# Patient Record
Sex: Female | Born: 1980 | Race: Black or African American | Hispanic: No | Marital: Married | Smoking: Never smoker
Health system: Southern US, Community
[De-identification: ages and names within clinical notes are randomized; demographics above are authoritative.]

## PROBLEM LIST (undated history)

## (undated) DIAGNOSIS — M1990 Unspecified osteoarthritis, unspecified site: Secondary | ICD-10-CM

## (undated) DIAGNOSIS — R112 Nausea with vomiting, unspecified: Secondary | ICD-10-CM

## (undated) DIAGNOSIS — I10 Essential (primary) hypertension: Secondary | ICD-10-CM

## (undated) DIAGNOSIS — D649 Anemia, unspecified: Secondary | ICD-10-CM

## (undated) DIAGNOSIS — Z9889 Other specified postprocedural states: Secondary | ICD-10-CM

---

## 2011-02-09 ENCOUNTER — Observation Stay (HOSPITAL_COMMUNITY)
Admission: AD | Admit: 2011-02-09 | Discharge: 2011-02-10 | Disposition: A | Discharge status: Home / Self Care | Payer: BC Managed Care – PPO | Source: Other Acute Inpatient Hospital | Attending: Internal Medicine | Admitting: Internal Medicine

## 2011-02-09 ENCOUNTER — Emergency Department (HOSPITAL_BASED_OUTPATIENT_CLINIC_OR_DEPARTMENT_OTHER)
Admission: EM | Admit: 2011-02-09 | Discharge: 2011-02-09 | Disposition: A | Discharge status: Nursing Home (Includes ICF) | Payer: BC Managed Care – PPO | Source: Home / Self Care | Attending: Emergency Medicine | Admitting: Emergency Medicine

## 2011-02-09 ENCOUNTER — Emergency Department (INDEPENDENT_AMBULATORY_CARE_PROVIDER_SITE_OTHER): Payer: BC Managed Care – PPO

## 2011-02-09 ENCOUNTER — Emergency Department (HOSPITAL_BASED_OUTPATIENT_CLINIC_OR_DEPARTMENT_OTHER): Payer: BC Managed Care – PPO

## 2011-02-09 DIAGNOSIS — I10 Essential (primary) hypertension: Secondary | ICD-10-CM | POA: Insufficient documentation

## 2011-02-09 DIAGNOSIS — R079 Chest pain, unspecified: Secondary | ICD-10-CM

## 2011-02-09 DIAGNOSIS — E669 Obesity, unspecified: Secondary | ICD-10-CM | POA: Insufficient documentation

## 2011-02-09 LAB — DIFFERENTIAL
Eosinophils Absolute: 0.1 10*3/uL (ref 0.0–0.7)
Lymphocytes Relative: 44 % (ref 12–46)
Lymphs Abs: 3.2 10*3/uL (ref 0.7–4.0)
Neutrophils Relative %: 46 % (ref 43–77)

## 2011-02-09 LAB — POCT CARDIAC MARKERS
CKMB, poc: <1 ng/mL — ABNORMAL LOW (ref 1.0–8.0)
CKMB, poc: <1 ng/mL — ABNORMAL LOW (ref 1.0–8.0)
Myoglobin, poc: 35.9 ng/mL (ref 12–200)
Troponin i, poc: <0.05 ng/mL (ref 0.00–0.09)

## 2011-02-09 LAB — CBC
MCV: 88.6 fL (ref 78.0–100.0)
Platelets: 262 10*3/uL (ref 150–400)
RBC: 4.21 MIL/uL (ref 3.87–5.11)
WBC: 7.3 10*3/uL (ref 4.0–10.5)

## 2011-02-09 LAB — BASIC METABOLIC PANEL
BUN: 13 mg/dL (ref 6–23)
Chloride: 106 mEq/L (ref 96–112)
Potassium: 3.6 mEq/L (ref 3.5–5.1)
Sodium: 142 mEq/L (ref 135–145)

## 2011-02-09 LAB — D-DIMER, QUANTITATIVE: D-Dimer, Quant: 0.35 ug/mL-FEU (ref 0.00–0.48)

## 2011-02-10 DIAGNOSIS — R079 Chest pain, unspecified: Secondary | ICD-10-CM

## 2011-02-10 LAB — CARDIAC PANEL(CRET KIN+CKTOT+MB+TROPI)
CK, MB: 1.1 ng/mL (ref 0.3–4.0)
Total CK: 192 U/L — ABNORMAL HIGH (ref 7–177)
Total CK: 200 U/L — ABNORMAL HIGH (ref 7–177)
Troponin I: 0.01 ng/mL (ref 0.00–0.06)
Troponin I: 0.02 ng/mL (ref 0.00–0.06)

## 2011-02-10 LAB — LIPID PANEL
HDL: 49 mg/dL (ref >39)
Total CHOL/HDL Ratio: 2.6 RATIO
VLDL: 20 mg/dL (ref 0–40)

## 2011-02-14 NOTE — Consult Note (Signed)
[name], [name]            ACCOUNT NO.:  [id number]  MEDICAL RECORD NO.:  [id number]           PATIENT TYPE:  O  LOCATION:  2010                         FACILITY:  [facility]  PHYSICIAN:  Madolyn Frieze. Jens Som, MD, FACCDATE OF BIRTH:  1981/04/16  DATE OF CONSULTATION:  02/10/2011 DATE OF DISCHARGE:  02/10/2011                                CONSULTATION   REFERRING PHYSICIAN:  Teressa Lower.  The patient is a 30 year old female with past medical history of hypertension who I am asked to evaluate for chest pain.  The patient typically does not have dyspnea on exertion, orthopnea, PND, palpitations, syncope, or exertional chest pain.  She occasionally has mild pedal edema.  On February 08, 2011, at approximately noon, the patient developed an aching in her substernal area.  There was no associated nausea, vomiting, or diaphoresis.  The pain was not pleuritic or positional nor is it related to food.  It was not exertional.  The pain persisted for the rest of the day on Tuesday.  When she awoke yesterday morning, the pain had resolved.  However, it did return and was intermittent throughout the day yesterday.  She therefore presented and was admitted for rule out myocardial infarction.  Chest pain is now resolved, and Cardiology is asked to further evaluate.  Note, she did have an episode of nausea with vomiting approximately 2 weeks ago, but no other URI symptoms recently.  She has not had fevers or chills.  Her present medications include Protonix.  SOCIAL HISTORY:  She does not smoke.  She rarely consumes alcohol.  She denies any cocaine use.  FAMILY HISTORY:  Negative for coronary artery disease.  PAST MEDICAL HISTORY:  Significant for hypertension, but there is no diabetes or hyperlipidemia.  She has no other past medical history.  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  REVIEW OF SYSTEMS:  She denies any headaches, fevers, or chills.  There is no productive cough or  hemoptysis.  There is no dysphagia, odynophagia, melena, or hematochezia.  There is no dysuria or hematuria. There is no rash or seizure activity.  There is no orthopnea, PND, or pedal edema at present.  The remaining systems are negative.  PHYSICAL EXAMINATION:  VITAL SIGNS:  Blood pressure of 112/60, pulse is 71, temperature is 98.1. GENERAL:  She is well-developed and somewhat obese.  She is in no acute distress at present. SKIN:  Warm and dry.  She does not appear to be depressed.  There is no peripheral clubbing. BACK:  Normal. HEENT:  Normal eyelids. NECK:  Supple with normal upstroke bilaterally and no bruits noted. There is no jugular venous distention and no thyromegaly is noted. CHEST:  Clear to auscultation with normal expansion. CARDIOVASCULAR:  Regular rate and rhythm.  Normal S1 and S2.  There are no murmurs, rubs, or gallops noted. ABDOMEN:  Nontender and nondistended.  Positive bowel sounds.  No hepatosplenomegaly.  No mass appreciated.  There is no abdominal bruit. EXTREMITIES:  She is 2+ femoral pulses bilaterally.  No bruits.  No edema.  I can palpate no cords.  She has 2+ dorsalis pedis pulses bilaterally.  NEUROLOGIC:  Grossly intact.  LABORATORY DATA:  White blood cell count of 7.3 with a hemoglobin of 12.8 and hematocrit 37.3, her platelet count is 262.  Her sodium is 142 with a potassium of 3.6.  BUN and creatinine are 13 and 0.7.  A D-dimer is normal.  Cardiac markers are negative.  Her LDL is 57 with an HDL of 49.  Her electrocardiogram today shows a sinus rhythm at a rate of 70. There is 1 mm of ST elevation and laterally.  There are nonspecific T- wave changes in the inferior leads.  There is left ventricle hypertrophy.  Chest x-ray shows streaky atelectasis.  DIAGNOSES: 1. Chest pain - the etiology of the patient's pain is unclear.  I do     not think the pain is consistent with an ischemic etiology.  Her     pain lasted for 12 hours on Tuesday and was  intermittent for most     today yesterday.  Her enzymes have been negative.  She also has a     paucity of risk factors.  She does have hypertension, but there is     no diabetes, hyperlipidemia, family history, or history of tobacco     use.  She also denies any cocaine use.  I do not think further     ischemia evaluation is indicated.  Given the lateral ST elevation,     I wonder if there may be a component of myocarditis or     pericarditis.  She did have an episode of nausea and vomiting     approximately 2 weeks ago, but no other upper respiratory     infections symptoms prior to this event.  We will await her     echocardiogram.  If normal, I think she most likely can be     discharged with a short course of nonsteroidals.  I wonder if there     may be a musculoskeletal component as well.  Note, her D-dimer is     normal as well. 2. Abnormal electrocardiogram - I wonder if this is secondary to     myocarditis or pericarditis.  There may be also a component of     repolarization abnormality.  Regardless, her enzymes are negative     and her story is not consistent with an ischemic etiology. 3. Hypertension - her blood pressure appears to be controlled on     present medications.  We will review the patient's echocardiogram, but again she can be discharged later today if it is normal.     Madolyn Frieze. Jens Som, MD, [facility]     BSC/MEDQ  D:  02/10/2011  T:  02/11/2011  Job:  [id number]  Electronically Signed by Olga Millers MD [facility] on 02/14/2011 07:25:17 PM

## 2011-02-27 NOTE — Discharge Summary (Signed)
  NAMECHRISTINNA, [name]            ACCOUNT NO.:  [id number]  MEDICAL RECORD NO.:  [id number]           PATIENT TYPE:  O  LOCATION:  2010                         FACILITY:  [facility]  PHYSICIAN:  Osvaldo Shipper, MD     DATE OF BIRTH:  1981/04/06  DATE OF ADMISSION:  02/09/2011 DATE OF DISCHARGE:  02/10/2011                              DISCHARGE SUMMARY   PRIMARY CARE PHYSICIAN:  Located at another town, [place].  CONSULTATION DURING THIS ADMISSION:  [facility], Dr. Jens Som.  IMAGING STUDIES DONE DURING THIS ADMISSION:  Included chest x-ray which did not show any acute abnormalities.  Other studies done include an echocardiogram which showed EF as 55-60% with normal wall motion without any other significant abnormalities.  PERTINENT LABS:  Include normal CBC, normal BMET, normal D-dimer. Cardiac enzymes were negative.  Lipid panel was also reasonably good.  DISCHARGE DIAGNOSES: 1. Chest pain, thought to be either musculoskeletal or some form of     myocarditis. 2. Obesity. 3. Hypertension.  BRIEF HOSPITAL COURSE:  Briefly, this is a 30 year old African American female who presented to the hospital with left-sided chest pain. Somewhat exertional at times.  The patient was evaluated in the ED and was found to have nonspecific EKG changes in the form of T inversion and some ST depression, especially in lead I and II.  The patient was subsequently admitted for further evaluation.  The patient ruled out for acute coronary syndrome.  Because of the exertional quality of her pain and her EKG findings, Cardiology was consulted and echocardiogram was already ordered.  Dr. Jens Som from [facility] saw the patient and felt that this is either musculoskeletal or some form of myocarditis and recommends NSAID use, so NSAIDs will be provided to this patient.  This can be taken as needed.  An echocardiogram also did not show any wall motion abnormality with normal EF.  In view of this,  Cardiology felt the patient could be discharged, so we will go ahead and discharge this patient.  DISCHARGE MEDICATIONS: 1. Ibuprofen 400 mg t.i.d. before meals as needed for chest pain. 2. Norvasc 5 mg 2 tablets daily. 3. Imelda Pillow birth control pill 1 tablet daily.  FOLLOWUP: 1. I will provide the patient with the phone number for Dr. Ludwig Clarks     office.  If she had anymore chest pain, she can call his office for     an appointment, 2. She needs to follow up with her PCP within 1-2 weeks.  DIET:  Heart healthy.  PHYSICAL ACTIVITY:  As tolerated.  TOTAL TIME ON THIS DISCHARGE ENCOUNTER:  Thirty minutes.  Osvaldo Shipper, MD     GK/MEDQ  D:  02/10/2011  T:  02/11/2011  Job:  [id number]  cc:   Madolyn Frieze. Jens Som, MD, [facility]  Electronically Signed by Osvaldo Shipper MD on 02/27/2011 09:52:06 PM

## 2011-03-03 NOTE — H&P (Signed)
[name], AHN NO.:  [id number]  MEDICAL RECORD NO.:  [id number]           PATIENT TYPE:  I  LOCATION:  2010                         FACILITY:  [facility]  PHYSICIAN:  Massie Maroon, MD        DATE OF BIRTH:  Oct 10, 1981  DATE OF ADMISSION:  02/09/2011 DATE OF DISCHARGE:                             HISTORY & PHYSICAL   CHIEF COMPLAINT:  Chest pain.  HISTORY OF PRESENT ILLNESS:  A 30 year old female with complaints of chest pain starting yesterday at about lunch time.  The pain was substernal and achy.  It lasted for 2 hours yesterday and then returned today intermittently.  The patient denies any fever, chills, cough, radiation of the pain, palpitations, nausea, vomiting, bright red blood per rectum, or black stool.  Nothing appeared to make it better or worse.  The patient denies any heartburn and there is no family history of heart disease.  The patient had an EKG that shows normal sinus rhythm at 70, right axis deviation, T-wave inversion in I, II, and aVL.  There is no prior EKG for comparison.  Chest x-ray was negative for any acute process.  The patient is presently chest pain free.  She apparently received aspirin in the ER.  The patient will be admitted for chest pain rule out.  PAST MEDICAL HISTORY:  Hypertension.  PAST SURGICAL HISTORY:  Keloid removal from her ear.  SOCIAL HISTORY:  The patient does not smoke or drink.  She works for [employer].  She is married.  FAMILY HISTORY:  Positive for cancer, but no heart disease.  ALLERGIES:  No known drug allergies.  MEDICATIONS:  Norvasc 5 mg p.o. b.i.d.  REVIEW OF SYSTEMS:  Negative for all 10 organ systems except for pertinent positives as stated above.  PHYSICAL EXAMINATION:  VITAL SIGNS:  Temperature 98.6, pulse 74, blood pressure 134/79, pulse ox 99% on room air. HEENT:  Anicteric. NECK:  No JVD.  No bruit. HEART:  Regular rate and rhythm.  S1 and S2.  No murmurs,  gallops, or rubs. LUNGS:  Clear to auscultation bilaterally. ABDOMEN:  Soft, nontender, nondistended.  Positive bowel sounds. EXTREMITIES:  No cyanosis, clubbing, or edema. SKIN:  No rashes. Lymph Nodes:  No adenopathy. NEUROLOGIC:  Nonfocal.  LABORATORY DATA:  WBC 7.3, hemoglobin 12.8, platelet count is 262. Sodium 142, potassium 3.6, BUN 13, creatinine 0.7.  D-dimer 0.35 negative.  Troponin-I less than 0.05, repeat troponin-I less than 0.05. Chest x-ray, heart is upper limits of normal, low lung volumes with vascular crowding and streaking atelectasis, no infiltrates, edema, or effusions.  EKG, see above HPI.  ASSESSMENT AND PLAN: 1. Chest pain:  The patient placed on telemetry.  We will check CK, CK-     MB, troponin I q.6 hours x3 sets.  We will check a lipid panel,     check cardiac 2-D echo and start patient on Protonix 40 mg p.o.     daily.  If patient remains chest pain free, she can have a further     workup as an outpatient with her primary care physician. 2. Deep vein thrombosis  prophylaxis:  Sequential compression devices.     Massie Maroon, MD     JYK/MEDQ  D:  02/09/2011  T:  02/10/2011  Job:  [id number]  Electronically Signed by Pearson Grippe MD on 03/03/2011 06:51:29 AM

## 2012-05-05 IMAGING — CR DG CHEST 1V PORT
1 series · 1 of 1 positions shown · non-contrast
Comparison: None

CLINICAL DATA: Chest pain.

PORTABLE CHEST - 1 VIEW

[view not recorded]
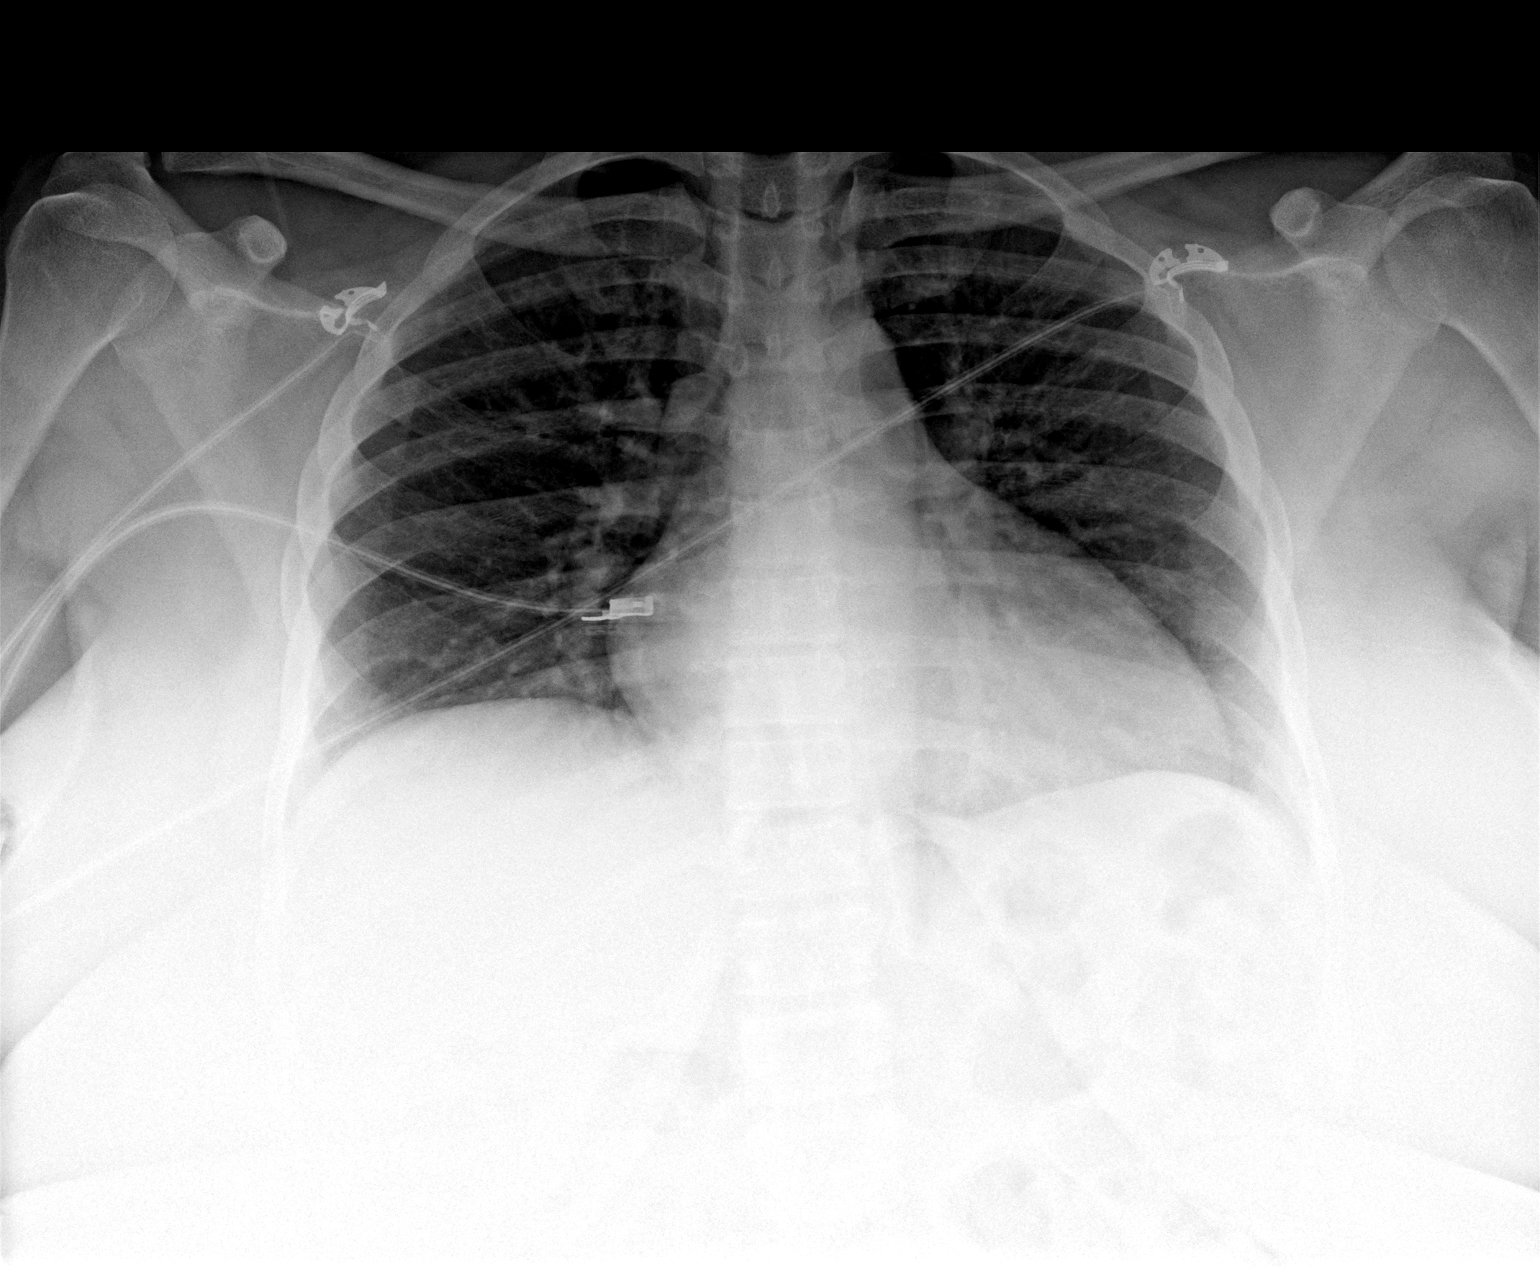

[1 of 1 positions shown; findings below may reference images not displayed]

FINDINGS: The heart is upper limits of normal given the portable AP
projection and low lung volumes.  Low lung volumes with vascular
crowding and areas of atelectasis but no infiltrates, edema or
effusions.  The bony thorax is intact.
IMPRESSION: Low lung volumes with vascular crowding and streaky atelectasis.
No infiltrates, edema or effusions.

## 2014-01-22 ENCOUNTER — Ambulatory Visit (HOSPITAL_BASED_OUTPATIENT_CLINIC_OR_DEPARTMENT_OTHER): Payer: BC Managed Care – PPO | Attending: Internal Medicine | Admitting: Radiology

## 2014-01-22 VITALS — Ht 64.0 in | Wt 245.0 lb

## 2014-01-22 DIAGNOSIS — R5381 Other malaise: Secondary | ICD-10-CM | POA: Insufficient documentation

## 2014-01-22 DIAGNOSIS — I10 Essential (primary) hypertension: Secondary | ICD-10-CM

## 2014-01-22 DIAGNOSIS — R5383 Other fatigue: Secondary | ICD-10-CM

## 2014-01-26 DIAGNOSIS — R5383 Other fatigue: Secondary | ICD-10-CM

## 2014-01-26 DIAGNOSIS — I10 Essential (primary) hypertension: Secondary | ICD-10-CM

## 2014-01-26 DIAGNOSIS — R5381 Other malaise: Secondary | ICD-10-CM

## 2014-01-26 DIAGNOSIS — G4730 Sleep apnea, unspecified: Secondary | ICD-10-CM

## 2014-01-26 DIAGNOSIS — G4710 Hypersomnia, unspecified: Secondary | ICD-10-CM

## 2014-01-26 NOTE — Sleep Study (Signed)
   NAME: [name] DATE OF BIRTH:  [birth year] MEDICAL RECORD NUMBER [id number]  LOCATION: [facility]  PHYSICIAN: Jaeli Grubb D Eryca Bolte  DATE OF STUDY: 01/22/2014  SLEEP STUDY TYPE: Nocturnal Polysomnogram               REFERRING PHYSICIAN: Baird Lyons D, MD  INDICATION FOR STUDY: Hypersomnia with sleep apnea  EPWORTH SLEEPINESS SCORE:   4/24 HEIGHT: 5\' 4"  (162.6 cm)  WEIGHT: 245 lb (111.131 kg)    Body mass index is 42.03 kg/(m^2).  NECK SIZE: 14.5 in.  MEDICATIONS: Charted for review  SLEEP ARCHITECTURE: Total sleep time 302 minutes with sleep efficiency 73.4%. Stage I was 9.6%, stage II 69.9%, stage III 0.2%, REM 20.4% of total sleep time. Sleep latency 15 minutes, REM latency 150 minutes, awake after sleep onset 59.5 minutes, arousal index 19.3. Bedtime medication: Amlodipine  RESPIRATORY DATA: Apnea hypopnea index (AHI) 2.6 per hour. 13 total events scored including one obstructive apnea, 4 central apneas, 8 hypopneas. Most events were while supine. REM AHI 9.8 per hour. CPAP titration was not done.  OXYGEN DATA: Mild intermittent snoring with oxygen desaturation to a nadir at 88% and mean oxygen saturation through the study of 95.3% on room air.  CARDIAC DATA: Normal sinus rhythm  MOVEMENT/PARASOMNIA: 28 limb jerks were counted but none were associated with sleep disturbance. Bathroom x1  IMPRESSION/ RECOMMENDATION:   1) Some difficulty initiating sleep, with sustained sleep not obtained until 12:15 AM. Otherwise unremarkable sleep pattern. 2) Occasional respiratory events with sleep disturbance, within normal limits.  AHI 2.6 per hour(the normal range for adults as an AHI from 0-5 events per hour). Mild intermittent snoring with oxygen desaturation to a nadir of 88% and mean oxygen saturation through the study of 95.3% on room air.   Signs Baird Lyons M.D. Deneise Lever is Diplomate, Tax adviser of Sleep Medicine  ELECTRONICALLY SIGNED ON:   01/26/2014, 8:32 AM [facility] PH: [phone]   FX: [phone] [place]

## 2015-10-01 ENCOUNTER — Emergency Department (HOSPITAL_BASED_OUTPATIENT_CLINIC_OR_DEPARTMENT_OTHER)
Admission: EM | Admit: 2015-10-01 | Discharge: 2015-10-01 | Disposition: A | Discharge status: Home / Self Care | Payer: BC Managed Care – PPO | Attending: Physician Assistant | Admitting: Physician Assistant

## 2015-10-01 ENCOUNTER — Encounter (HOSPITAL_BASED_OUTPATIENT_CLINIC_OR_DEPARTMENT_OTHER): Payer: Self-pay

## 2015-10-01 DIAGNOSIS — I10 Essential (primary) hypertension: Secondary | ICD-10-CM | POA: Diagnosis not present

## 2015-10-01 DIAGNOSIS — Z3202 Encounter for pregnancy test, result negative: Secondary | ICD-10-CM | POA: Diagnosis not present

## 2015-10-01 DIAGNOSIS — R112 Nausea with vomiting, unspecified: Secondary | ICD-10-CM | POA: Diagnosis not present

## 2015-10-01 DIAGNOSIS — E669 Obesity, unspecified: Secondary | ICD-10-CM | POA: Insufficient documentation

## 2015-10-01 HISTORY — DX: Essential (primary) hypertension: I10

## 2015-10-01 LAB — URINALYSIS, ROUTINE W REFLEX MICROSCOPIC
Bilirubin Urine: NEGATIVE
Glucose, UA: NEGATIVE mg/dL
Hgb urine dipstick: NEGATIVE
Ketones, ur: NEGATIVE mg/dL
LEUKOCYTES UA: NEGATIVE
Nitrite: NEGATIVE
PROTEIN: NEGATIVE mg/dL
Specific Gravity, Urine: 1.014 (ref 1.005–1.030)
pH: 7 (ref 5.0–8.0)

## 2015-10-01 LAB — COMPREHENSIVE METABOLIC PANEL
ALBUMIN: 3.8 g/dL (ref 3.5–5.0)
ALT: 16 U/L (ref 14–54)
AST: 22 U/L (ref 15–41)
Alkaline Phosphatase: 86 U/L (ref 38–126)
Anion gap: 7 (ref 5–15)
BUN: 13 mg/dL (ref 6–20)
CHLORIDE: 105 mmol/L (ref 101–111)
CO2: 26 mmol/L (ref 22–32)
Calcium: 8.9 mg/dL (ref 8.9–10.3)
Creatinine, Ser: 0.86 mg/dL (ref 0.44–1.00)
GFR calc Af Amer: >60 mL/min (ref >60)
GLUCOSE: 150 mg/dL — AB (ref 65–99)
POTASSIUM: 3.2 mmol/L — AB (ref 3.5–5.1)
SODIUM: 138 mmol/L (ref 135–145)
Total Bilirubin: 0.4 mg/dL (ref 0.3–1.2)
Total Protein: 7.5 g/dL (ref 6.5–8.1)

## 2015-10-01 LAB — CBC WITH DIFFERENTIAL/PLATELET
Basophils Absolute: 0 10*3/uL (ref 0.0–0.1)
Basophils Relative: 0 %
EOS PCT: 1 %
Eosinophils Absolute: 0.1 10*3/uL (ref 0.0–0.7)
HCT: 40.2 % (ref 36.0–46.0)
Hemoglobin: 13.3 g/dL (ref 12.0–15.0)
LYMPHS ABS: 3 10*3/uL (ref 0.7–4.0)
LYMPHS PCT: 32 %
MCH: 30.2 pg (ref 26.0–34.0)
MCHC: 33.1 g/dL (ref 30.0–36.0)
MCV: 91.4 fL (ref 78.0–100.0)
Monocytes Absolute: 0.6 10*3/uL (ref 0.1–1.0)
Monocytes Relative: 7 %
Neutro Abs: 5.5 10*3/uL (ref 1.7–7.7)
Neutrophils Relative %: 60 %
PLATELETS: 312 10*3/uL (ref 150–400)
RBC: 4.4 MIL/uL (ref 3.87–5.11)
RDW: 13.3 % (ref 11.5–15.5)
WBC: 9.2 10*3/uL (ref 4.0–10.5)

## 2015-10-01 LAB — PREGNANCY, URINE: PREG TEST UR: NEGATIVE

## 2015-10-01 MED ORDER — ONDANSETRON 4 MG PO TBDP
4.0000 mg | ORAL_TABLET | Freq: Once | ORAL | Status: AC
  Administered 2015-10-01: 4 mg via ORAL
  Filled 2015-10-01: qty 1

## 2015-10-01 MED ORDER — KETOROLAC TROMETHAMINE 30 MG/ML IJ SOLN: 30.0000 mg | Freq: Once | INTRAMUSCULAR | Status: DC

## 2015-10-01 MED ORDER — SODIUM CHLORIDE 0.9 % IV BOLUS (SEPSIS)
1000.0000 mL | Freq: Once | INTRAVENOUS | Status: AC
  Administered 2015-10-01: 1000 mL via INTRAVENOUS

## 2015-10-01 MED ORDER — ONDANSETRON HCL 4 MG/2ML IJ SOLN
4.0000 mg | Freq: Once | INTRAMUSCULAR | Status: AC
  Administered 2015-10-01: 4 mg via INTRAVENOUS
  Filled 2015-10-01: qty 2

## 2015-10-01 MED ORDER — ONDANSETRON HCL 4 MG PO TABS: 4.0000 mg | ORAL_TABLET | Freq: Three times a day (TID) | ORAL | Status: DC | PRN

## 2015-10-01 MED ORDER — DICYCLOMINE HCL 10 MG PO CAPS
10.0000 mg | ORAL_CAPSULE | Freq: Once | ORAL | Status: AC
  Administered 2015-10-01: 10 mg via ORAL
  Filled 2015-10-01: qty 1

## 2015-10-01 NOTE — ED Notes (Signed)
Pt up to use the bathroom

## 2015-10-01 NOTE — ED Notes (Signed)
Pt up to use the bathroom and had an episode of vomiting.

## 2015-10-01 NOTE — ED Provider Notes (Signed)
CSN: [id number]     Arrival date & time 10/01/15  0107 History   First MD Initiated Contact with Patient 10/01/15 0124     Chief Complaint  Patient presents with  . Abdominal Pain     (Consider location/radiation/quality/duration/timing/severity/associated sxs/prior Treatment) HPI  Patient is an obese 34 year old female presenting today with sudden onset of vomiting. Patient had 3 episodes of vomiting starting 2 hours prior to arrival. She tried nothing home to make it better or worse. Patient states she ate her normal dinner with her husband last night which was garlic bread with tomato sauce on it. Patient denies any diarrhea yet. Denies any fever. Denies any sick contacts. Denies any exposure to children.     Past Medical History  Diagnosis Date  . Hypertension    History reviewed. No pertinent past surgical history. No family history on file. Social History  Substance Use Topics  . Smoking status: None  . Smokeless tobacco: None  . Alcohol Use: None   OB History    No data available     Review of Systems  Constitutional: Negative for fever, activity change and fatigue.  Respiratory: Negative for shortness of breath.   Cardiovascular: Negative for chest pain.  Gastrointestinal: Positive for nausea and vomiting. Negative for abdominal pain.  Genitourinary: Negative for dysuria.  Musculoskeletal: Negative for back pain.  Psychiatric/Behavioral: Negative for agitation.      Allergies  Review of patient's allergies indicates no known allergies.  Home Medications   Prior to Admission medications   Not on File   BP 132/81 mmHg  Pulse 87  Temp(Src) 98.3 F (36.8 C) (Oral)  Resp 18  Ht 5\' 4"  (1.626 m)  Wt 251 lb (113.853 kg)  BMI 43.06 kg/m2  SpO2 100%  LMP 09/24/2015 Physical Exam  Constitutional: She is oriented to person, place, and time. She appears well-developed and well-nourished.  Obese African American female  HENT:  Head: Normocephalic and  atraumatic.  Eyes: Conjunctivae are normal. Right eye exhibits no discharge.  Neck: Neck supple.  Cardiovascular: Normal rate, regular rhythm and normal heart sounds.   No murmur heard. Pulmonary/Chest: Effort normal and breath sounds normal. She has no wheezes. She has no rales.  Abdominal: Soft. She exhibits no distension. There is no tenderness.  Musculoskeletal: Normal range of motion. She exhibits no edema.  Neurological: She is oriented to person, place, and time. No cranial nerve deficit.  Skin: Skin is warm and dry. No rash noted. She is not diaphoretic.  Psychiatric: She has a normal mood and affect. Her behavior is normal.  Nursing note and vitals reviewed.   ED Course  Procedures (including critical care time) Labs Review Labs Reviewed  URINALYSIS, ROUTINE W REFLEX MICROSCOPIC (NOT AT [facility])  PREGNANCY, URINE  COMPREHENSIVE METABOLIC PANEL  CBC WITH DIFFERENTIAL/PLATELET    Imaging Review No results found. I have personally reviewed and evaluated these images and lab results as part of my medical decision-making.   EKG Interpretation None      MDM   Final diagnoses:  None   patient is a pleasant 34 year old African-American female presenting with 3 episodes of vomiting. Patient has no abdominal tenderness on exam. Patient is not had any diarrhea. I think this is likely viral gastro-tenderness in its early stages. Could also be food borne illness.  We will check her labs, give her fluids check urine for pregnancy and UTI.  Patient has normal vital signs and is hydrated on exam. She has no abdominal tenderness.  For these reasons she will likely be able to be discharged home.  Patient continues to have normal vital sounds, no abdominal pain to palpation. She received 1 L of fluid, has normal labs and tolerated PO.    Will give her zofran for home and return precautions.   Jazlen Ogarro Julio Alm, MD 10/01/15 [phone]

## 2015-10-01 NOTE — ED Notes (Signed)
Pt c/o midepigastric pain that started about an hour ago with three episodes of vomiting.  Pt denies diarrhea, denies fever, no sick contacts either.

## 2015-10-01 NOTE — ED Notes (Signed)
Pt up to use restroom, tolerated PO fluids.  Pt verbalizes understanding of d/c instructions and denies any further needs at this time.

## 2015-10-01 NOTE — Discharge Instructions (Signed)
We are unsure what is causing your nausea nd vomitng.  If you have any pain in your abdomen, fever or dehydration please return.  Nausea and Vomiting Nausea is a sick feeling that often comes before throwing up (vomiting). Vomiting is a reflex where stomach contents come out of your mouth. Vomiting can cause severe loss of body fluids (dehydration). Children and elderly adults can become dehydrated quickly, especially if they also have diarrhea. Nausea and vomiting are symptoms of a condition or disease. It is important to find the cause of your symptoms. CAUSES   Direct irritation of the stomach lining. This irritation can result from increased acid production (gastroesophageal reflux disease), infection, food poisoning, taking certain medicines (such as nonsteroidal anti-inflammatory drugs), alcohol use, or tobacco use.  Signals from the brain.These signals could be caused by a headache, heat exposure, an inner ear disturbance, increased pressure in the brain from injury, infection, a tumor, or a concussion, pain, emotional stimulus, or metabolic problems.  An obstruction in the gastrointestinal tract (bowel obstruction).  Illnesses such as diabetes, hepatitis, gallbladder problems, appendicitis, kidney problems, cancer, sepsis, atypical symptoms of a heart attack, or eating disorders.  Medical treatments such as chemotherapy and radiation.  Receiving medicine that makes you sleep (general anesthetic) during surgery. DIAGNOSIS Your caregiver may ask for tests to be done if the problems do not improve after a few days. Tests may also be done if symptoms are severe or if the reason for the nausea and vomiting is not clear. Tests may include:  Urine tests.  Blood tests.  Stool tests.  Cultures (to look for evidence of infection).  X-rays or other imaging studies. Test results can help your caregiver make decisions about treatment or the need for additional tests. TREATMENT You need to  stay well hydrated. Drink frequently but in small amounts.You may wish to drink water, sports drinks, clear broth, or eat frozen ice pops or gelatin dessert to help stay hydrated.When you eat, eating slowly may help prevent nausea.There are also some antinausea medicines that may help prevent nausea. HOME CARE INSTRUCTIONS   Take all medicine as directed by your caregiver.  If you do not have an appetite, do not force yourself to eat. However, you must continue to drink fluids.  If you have an appetite, eat a normal diet unless your caregiver tells you differently.  Eat a variety of complex carbohydrates (rice, wheat, potatoes, bread), lean meats, yogurt, fruits, and vegetables.  Avoid high-fat foods because they are more difficult to digest.  Drink enough water and fluids to keep your urine clear or pale yellow.  If you are dehydrated, ask your caregiver for specific rehydration instructions. Signs of dehydration may include:  Severe thirst.  Dry lips and mouth.  Dizziness.  Dark urine.  Decreasing urine frequency and amount.  Confusion.  Rapid breathing or pulse. SEEK IMMEDIATE MEDICAL CARE IF:   You have blood or brown flecks (like coffee grounds) in your vomit.  You have black or bloody stools.  You have a severe headache or stiff neck.  You are confused.  You have severe abdominal pain.  You have chest pain or trouble breathing.  You do not urinate at least once every 8 hours.  You develop cold or clammy skin.  You continue to vomit for longer than 24 to 48 hours.  You have a fever. MAKE SURE YOU:   Understand these instructions.  Will watch your condition.  Will get help right away if you are not  doing well or get worse.   This information is not intended to replace advice given to you by your health care provider. Make sure you discuss any questions you have with your health care provider.   Document Released: 10/03/2005 Document Revised:  12/26/2011 Document Reviewed: 03/02/2011 Elsevier Interactive Patient Education [employer].

## 2017-11-06 ENCOUNTER — Encounter (HOSPITAL_COMMUNITY)
Admission: EM | Disposition: A | Discharge status: Home / Self Care | Payer: Self-pay | Source: Home / Self Care | Attending: Surgery

## 2017-11-06 ENCOUNTER — Inpatient Hospital Stay (HOSPITAL_COMMUNITY): Payer: BC Managed Care – PPO | Admitting: Certified Registered"

## 2017-11-06 ENCOUNTER — Inpatient Hospital Stay (HOSPITAL_COMMUNITY)
Admission: EM | Admit: 2017-11-06 | Discharge: 2017-11-07 | DRG: 418 | Disposition: A | Discharge status: Home / Self Care | Payer: BC Managed Care – PPO | Attending: Surgery | Admitting: Surgery

## 2017-11-06 ENCOUNTER — Encounter (HOSPITAL_COMMUNITY): Payer: Self-pay | Admitting: Emergency Medicine

## 2017-11-06 DIAGNOSIS — K5909 Other constipation: Secondary | ICD-10-CM | POA: Diagnosis present

## 2017-11-06 DIAGNOSIS — K8012 Calculus of gallbladder with acute and chronic cholecystitis without obstruction: Principal | ICD-10-CM | POA: Diagnosis present

## 2017-11-06 DIAGNOSIS — K810 Acute cholecystitis: Secondary | ICD-10-CM

## 2017-11-06 DIAGNOSIS — K82A2 Perforation of gallbladder in cholecystitis: Secondary | ICD-10-CM | POA: Diagnosis present

## 2017-11-06 DIAGNOSIS — I10 Essential (primary) hypertension: Secondary | ICD-10-CM | POA: Diagnosis present

## 2017-11-06 HISTORY — PX: CHOLECYSTECTOMY: SHX55

## 2017-11-06 LAB — COMPREHENSIVE METABOLIC PANEL
ALBUMIN: 3.1 g/dL — AB (ref 3.5–5.0)
ALK PHOS: 50 U/L (ref 38–126)
ALT: 15 U/L (ref 14–54)
AST: 21 U/L (ref 15–41)
Anion gap: 8 (ref 5–15)
BUN: 7 mg/dL (ref 6–20)
CO2: 24 mmol/L (ref 22–32)
CREATININE: 0.94 mg/dL (ref 0.44–1.00)
Calcium: 8.5 mg/dL — ABNORMAL LOW (ref 8.9–10.3)
Chloride: 107 mmol/L (ref 101–111)
GFR calc Af Amer: >60 mL/min (ref >60)
GFR calc non Af Amer: >60 mL/min (ref >60)
GLUCOSE: 113 mg/dL — AB (ref 65–99)
POTASSIUM: 2.8 mmol/L — AB (ref 3.5–5.1)
Sodium: 139 mmol/L (ref 135–145)
Total Bilirubin: 0.9 mg/dL (ref 0.3–1.2)
Total Protein: 6.4 g/dL — ABNORMAL LOW (ref 6.5–8.1)

## 2017-11-06 LAB — CBC
HCT: 39.2 % (ref 36.0–46.0)
HEMOGLOBIN: 13.2 g/dL (ref 12.0–15.0)
MCH: 31.5 pg (ref 26.0–34.0)
MCHC: 33.7 g/dL (ref 30.0–36.0)
MCV: 93.6 fL (ref 78.0–100.0)
PLATELETS: 279 10*3/uL (ref 150–400)
RBC: 4.19 MIL/uL (ref 3.87–5.11)
RDW: 14.7 % (ref 11.5–15.5)
WBC: 13.7 10*3/uL — AB (ref 4.0–10.5)

## 2017-11-06 LAB — CBC WITH DIFFERENTIAL/PLATELET
BASOS ABS: 0 10*3/uL (ref 0.0–0.1)
BASOS PCT: 0 %
Eosinophils Absolute: 0 10*3/uL (ref 0.0–0.7)
Eosinophils Relative: 0 %
HEMATOCRIT: 39.4 % (ref 36.0–46.0)
HEMOGLOBIN: 13.2 g/dL (ref 12.0–15.0)
LYMPHS PCT: 5 %
Lymphs Abs: 0.5 10*3/uL — ABNORMAL LOW (ref 0.7–4.0)
MCH: 31.2 pg (ref 26.0–34.0)
MCHC: 33.5 g/dL (ref 30.0–36.0)
MCV: 93.1 fL (ref 78.0–100.0)
Monocytes Absolute: 0.1 10*3/uL (ref 0.1–1.0)
Monocytes Relative: 1 %
NEUTROS ABS: 9.3 10*3/uL — AB (ref 1.7–7.7)
NEUTROS PCT: 94 %
Platelets: 243 10*3/uL (ref 150–400)
RBC: 4.23 MIL/uL (ref 3.87–5.11)
RDW: 14.5 % (ref 11.5–15.5)
WBC: 10 10*3/uL (ref 4.0–10.5)

## 2017-11-06 LAB — CREATININE, SERUM: CREATININE: 0.89 mg/dL (ref 0.44–1.00)

## 2017-11-06 LAB — LIPASE, BLOOD: LIPASE: 21 U/L (ref 11–51)

## 2017-11-06 LAB — PREGNANCY, URINE: Preg Test, Ur: NEGATIVE

## 2017-11-06 SURGERY — LAPAROSCOPIC CHOLECYSTECTOMY
Anesthesia: General | Site: Abdomen

## 2017-11-06 MED ORDER — ROCURONIUM BROMIDE 10 MG/ML (PF) SYRINGE
PREFILLED_SYRINGE | INTRAVENOUS | Status: DC | PRN
  Administered 2017-11-06: 20 mg via INTRAVENOUS
  Administered 2017-11-06: 50 mg via INTRAVENOUS

## 2017-11-06 MED ORDER — SODIUM CHLORIDE 0.9 % IR SOLN
Status: DC | PRN
  Administered 2017-11-06: 1000 mL

## 2017-11-06 MED ORDER — HYDROMORPHONE HCL 1 MG/ML IJ SOLN
INTRAMUSCULAR | Status: AC
  Administered 2017-11-06: 0.5 mg via INTRAVENOUS
  Filled 2017-11-06: qty 1

## 2017-11-06 MED ORDER — PROPOFOL 10 MG/ML IV BOLUS
INTRAVENOUS | Status: AC
  Filled 2017-11-06: qty 20

## 2017-11-06 MED ORDER — FENTANYL CITRATE (PF) 250 MCG/5ML IJ SOLN
INTRAMUSCULAR | Status: AC
  Filled 2017-11-06: qty 5

## 2017-11-06 MED ORDER — HYDROMORPHONE HCL 1 MG/ML IJ SOLN
0.2500 mg | INTRAMUSCULAR | Status: DC | PRN
  Administered 2017-11-06 (×4): 0.5 mg via INTRAVENOUS

## 2017-11-06 MED ORDER — PIPERACILLIN-TAZOBACTAM 3.375 G IVPB
3.3750 g | Freq: Three times a day (TID) | INTRAVENOUS | Status: DC
  Administered 2017-11-06 – 2017-11-07 (×2): 3.375 g via INTRAVENOUS
  Filled 2017-11-06 (×4): qty 50

## 2017-11-06 MED ORDER — MEPERIDINE HCL 25 MG/ML IJ SOLN: 6.2500 mg | INTRAMUSCULAR | Status: DC | PRN

## 2017-11-06 MED ORDER — LACTATED RINGERS IV SOLN: INTRAVENOUS | Status: DC

## 2017-11-06 MED ORDER — ONDANSETRON HCL 4 MG/2ML IJ SOLN
INTRAMUSCULAR | Status: DC | PRN
  Administered 2017-11-06: 4 mg via INTRAVENOUS

## 2017-11-06 MED ORDER — 0.9 % SODIUM CHLORIDE (POUR BTL) OPTIME
TOPICAL | Status: DC | PRN
  Administered 2017-11-06: 1000 mL

## 2017-11-06 MED ORDER — PHENYLEPHRINE 40 MCG/ML (10ML) SYRINGE FOR IV PUSH (FOR BLOOD PRESSURE SUPPORT)
PREFILLED_SYRINGE | INTRAVENOUS | Status: AC
  Filled 2017-11-06: qty 10

## 2017-11-06 MED ORDER — FENTANYL CITRATE (PF) 250 MCG/5ML IJ SOLN
INTRAMUSCULAR | Status: DC | PRN
  Administered 2017-11-06: 50 ug via INTRAVENOUS
  Administered 2017-11-06: 100 ug via INTRAVENOUS

## 2017-11-06 MED ORDER — ONDANSETRON 4 MG PO TBDP: 4.0000 mg | ORAL_TABLET | Freq: Four times a day (QID) | ORAL | Status: DC | PRN

## 2017-11-06 MED ORDER — HYDROCODONE-ACETAMINOPHEN 5-325 MG PO TABS
1.0000 | ORAL_TABLET | ORAL | Status: DC | PRN
  Administered 2017-11-06 – 2017-11-07 (×2): 2 via ORAL
  Filled 2017-11-06 (×2): qty 2

## 2017-11-06 MED ORDER — SUGAMMADEX SODIUM 200 MG/2ML IV SOLN
INTRAVENOUS | Status: DC | PRN
  Administered 2017-11-06: 200 mg via INTRAVENOUS

## 2017-11-06 MED ORDER — POTASSIUM CHLORIDE 10 MEQ/100ML IV SOLN
10.0000 meq | INTRAVENOUS | Status: AC
  Administered 2017-11-06: 10 meq via INTRAVENOUS
  Filled 2017-11-06 (×2): qty 100

## 2017-11-06 MED ORDER — MIDAZOLAM HCL 5 MG/5ML IJ SOLN
INTRAMUSCULAR | Status: DC | PRN
  Administered 2017-11-06: 2 mg via INTRAVENOUS

## 2017-11-06 MED ORDER — ONDANSETRON HCL 4 MG/2ML IJ SOLN
INTRAMUSCULAR | Status: AC
  Filled 2017-11-06: qty 2

## 2017-11-06 MED ORDER — BUPIVACAINE HCL 0.25 % IJ SOLN
INTRAMUSCULAR | Status: DC | PRN
  Administered 2017-11-06: 6 mL

## 2017-11-06 MED ORDER — DOCUSATE SODIUM 100 MG PO CAPS
200.0000 mg | ORAL_CAPSULE | Freq: Two times a day (BID) | ORAL | Status: DC
  Administered 2017-11-06 – 2017-11-07 (×2): 200 mg via ORAL
  Filled 2017-11-06 (×2): qty 2

## 2017-11-06 MED ORDER — LIDOCAINE 2% (20 MG/ML) 5 ML SYRINGE
INTRAMUSCULAR | Status: AC
  Filled 2017-11-06: qty 5

## 2017-11-06 MED ORDER — PROPOFOL 10 MG/ML IV BOLUS
INTRAVENOUS | Status: DC | PRN
  Administered 2017-11-06: 200 mg via INTRAVENOUS

## 2017-11-06 MED ORDER — HYDROMORPHONE HCL 1 MG/ML IJ SOLN: 1.0000 mg | INTRAMUSCULAR | Status: DC | PRN

## 2017-11-06 MED ORDER — DIPHENHYDRAMINE HCL 12.5 MG/5ML PO ELIX: 12.5000 mg | ORAL_SOLUTION | Freq: Four times a day (QID) | ORAL | Status: DC | PRN

## 2017-11-06 MED ORDER — MIDAZOLAM HCL 2 MG/2ML IJ SOLN
INTRAMUSCULAR | Status: AC
  Filled 2017-11-06: qty 2

## 2017-11-06 MED ORDER — HYDROMORPHONE HCL 1 MG/ML IJ SOLN: 0.2500 mg | INTRAMUSCULAR | Status: DC | PRN

## 2017-11-06 MED ORDER — PHENYLEPHRINE 40 MCG/ML (10ML) SYRINGE FOR IV PUSH (FOR BLOOD PRESSURE SUPPORT)
PREFILLED_SYRINGE | INTRAVENOUS | Status: DC | PRN
  Administered 2017-11-06: 80 ug via INTRAVENOUS
  Administered 2017-11-06: 120 ug via INTRAVENOUS
  Administered 2017-11-06: 80 ug via INTRAVENOUS
  Administered 2017-11-06: 160 ug via INTRAVENOUS

## 2017-11-06 MED ORDER — PIPERACILLIN-TAZOBACTAM 3.375 G IVPB 30 MIN
3.3750 g | Freq: Once | INTRAVENOUS | Status: AC
  Administered 2017-11-06: 3.375 g via INTRAVENOUS
  Filled 2017-11-06: qty 50

## 2017-11-06 MED ORDER — DIPHENHYDRAMINE HCL 50 MG/ML IJ SOLN: 12.5000 mg | Freq: Four times a day (QID) | INTRAMUSCULAR | Status: DC | PRN

## 2017-11-06 MED ORDER — HYDROMORPHONE HCL 1 MG/ML IJ SOLN: 0.5000 mg | INTRAMUSCULAR | Status: DC | PRN

## 2017-11-06 MED ORDER — ACETAMINOPHEN 10 MG/ML IV SOLN: 1000.0000 mg | Freq: Once | INTRAVENOUS | Status: DC | PRN

## 2017-11-06 MED ORDER — HYDROCODONE-ACETAMINOPHEN 7.5-325 MG PO TABS: 1.0000 | ORAL_TABLET | Freq: Once | ORAL | Status: DC | PRN

## 2017-11-06 MED ORDER — SUGAMMADEX SODIUM 200 MG/2ML IV SOLN
INTRAVENOUS | Status: AC
  Filled 2017-11-06: qty 2

## 2017-11-06 MED ORDER — LIDOCAINE 2% (20 MG/ML) 5 ML SYRINGE
INTRAMUSCULAR | Status: DC | PRN
  Administered 2017-11-06: 60 mg via INTRAVENOUS

## 2017-11-06 MED ORDER — OXYCODONE HCL 5 MG PO TABS
10.0000 mg | ORAL_TABLET | Freq: Four times a day (QID) | ORAL | Status: DC | PRN
  Administered 2017-11-07: 10 mg via ORAL
  Filled 2017-11-06: qty 2

## 2017-11-06 MED ORDER — NORETHINDRONE 0.35 MG PO TABS: 1.0000 | ORAL_TABLET | Freq: Every day | ORAL | Status: DC

## 2017-11-06 MED ORDER — HEPARIN SODIUM (PORCINE) 5000 UNIT/ML IJ SOLN
5000.0000 [IU] | Freq: Three times a day (TID) | INTRAMUSCULAR | Status: DC
  Administered 2017-11-06 – 2017-11-07 (×3): 5000 [IU] via SUBCUTANEOUS
  Filled 2017-11-06 (×3): qty 1

## 2017-11-06 MED ORDER — ONDANSETRON HCL 4 MG/2ML IJ SOLN: 4.0000 mg | Freq: Four times a day (QID) | INTRAMUSCULAR | Status: DC | PRN

## 2017-11-06 MED ORDER — ROCURONIUM BROMIDE 10 MG/ML (PF) SYRINGE
PREFILLED_SYRINGE | INTRAVENOUS | Status: AC
  Filled 2017-11-06: qty 5

## 2017-11-06 MED ORDER — DEXAMETHASONE SODIUM PHOSPHATE 10 MG/ML IJ SOLN
INTRAMUSCULAR | Status: AC
  Filled 2017-11-06: qty 1

## 2017-11-06 MED ORDER — HYDRALAZINE HCL 20 MG/ML IJ SOLN: 10.0000 mg | INTRAMUSCULAR | Status: DC | PRN

## 2017-11-06 MED ORDER — PIPERACILLIN-TAZOBACTAM 3.375 G IVPB: 3.3750 g | Freq: Three times a day (TID) | INTRAVENOUS | Status: DC

## 2017-11-06 MED ORDER — LACTATED RINGERS IV SOLN
INTRAVENOUS | Status: DC
  Administered 2017-11-06 (×2): via INTRAVENOUS

## 2017-11-06 MED ORDER — DEXTROSE-NACL 5-0.9 % IV SOLN
INTRAVENOUS | Status: DC
  Administered 2017-11-06 – 2017-11-07 (×2): via INTRAVENOUS

## 2017-11-06 MED ORDER — ONDANSETRON HCL 4 MG/2ML IJ SOLN: 4.0000 mg | Freq: Once | INTRAMUSCULAR | Status: DC | PRN

## 2017-11-06 MED ORDER — DEXAMETHASONE SODIUM PHOSPHATE 10 MG/ML IJ SOLN
INTRAMUSCULAR | Status: DC | PRN
  Administered 2017-11-06: 10 mg via INTRAVENOUS

## 2017-11-06 MED ORDER — PROMETHAZINE HCL 25 MG/ML IJ SOLN: 6.2500 mg | INTRAMUSCULAR | Status: DC | PRN

## 2017-11-06 SURGICAL SUPPLY — 44 items
APPLIER CLIP 5 13 M/L LIGAMAX5 (MISCELLANEOUS)
BENZOIN TINCTURE PRP APPL 2/3 (GAUZE/BANDAGES/DRESSINGS) ×4 IMPLANT
CANISTER SUCT 3000ML PPV (MISCELLANEOUS) ×4 IMPLANT
CHLORAPREP W/TINT 26ML (MISCELLANEOUS) ×4 IMPLANT
CLIP APPLIE 5 13 M/L LIGAMAX5 (MISCELLANEOUS) IMPLANT
CLIP VESOLOCK MED LG 6/CT (CLIP) ×4 IMPLANT
CLOSURE WOUND 1/2 X4 (GAUZE/BANDAGES/DRESSINGS) ×1
COVER MAYO STAND STRL (DRAPES) IMPLANT
COVER SURGICAL LIGHT HANDLE (MISCELLANEOUS) ×4 IMPLANT
COVER TRANSDUCER ULTRASND (DRAPES) IMPLANT
DRAPE C-ARM 42X72 X-RAY (DRAPES) IMPLANT
ELECT REM PT RETURN 9FT ADLT (ELECTROSURGICAL) ×4
ELECTRODE REM PT RTRN 9FT ADLT (ELECTROSURGICAL) ×2 IMPLANT
GAUZE SPONGE 2X2 8PLY STRL LF (GAUZE/BANDAGES/DRESSINGS) ×2 IMPLANT
GLOVE BIO SURGEON STRL SZ7.5 (GLOVE) ×4 IMPLANT
GOWN STRL REUS W/ TWL LRG LVL3 (GOWN DISPOSABLE) ×4 IMPLANT
GOWN STRL REUS W/ TWL XL LVL3 (GOWN DISPOSABLE) ×2 IMPLANT
GOWN STRL REUS W/TWL LRG LVL3 (GOWN DISPOSABLE) ×4
GOWN STRL REUS W/TWL XL LVL3 (GOWN DISPOSABLE) ×2
GRASPER SUT TROCAR 14GX15 (MISCELLANEOUS) ×4 IMPLANT
IV CATH 14GX2 1/4 (CATHETERS) IMPLANT
KIT BASIN OR (CUSTOM PROCEDURE TRAY) ×4 IMPLANT
KIT ROOM TURNOVER OR (KITS) ×4 IMPLANT
NEEDLE INSUFFLATION 14GA 120MM (NEEDLE) ×4 IMPLANT
NS IRRIG 1000ML POUR BTL (IV SOLUTION) ×4 IMPLANT
PAD ARMBOARD 7.5X6 YLW CONV (MISCELLANEOUS) ×8 IMPLANT
POUCH RETRIEVAL ECOSAC 10 (ENDOMECHANICALS) ×2 IMPLANT
POUCH RETRIEVAL ECOSAC 10MM (ENDOMECHANICALS) ×2
SCISSORS LAP 5X35 DISP (ENDOMECHANICALS) ×4 IMPLANT
SET CHOLANGIOGRAPHY FRANKLIN (SET/KITS/TRAYS/PACK) IMPLANT
SET IRRIG TUBING LAPAROSCOPIC (IRRIGATION / IRRIGATOR) ×4 IMPLANT
SLEEVE ENDOPATH XCEL 5M (ENDOMECHANICALS) ×8 IMPLANT
SPECIMEN JAR SMALL (MISCELLANEOUS) ×4 IMPLANT
SPONGE GAUZE 2X2 STER 10/PKG (GAUZE/BANDAGES/DRESSINGS) ×2
STOPCOCK 4 WAY LG BORE MALE ST (IV SETS) IMPLANT
STRIP CLOSURE SKIN 1/2X4 (GAUZE/BANDAGES/DRESSINGS) ×3 IMPLANT
SUT MNCRL AB 3-0 PS2 18 (SUTURE) ×4 IMPLANT
TOWEL OR 17X24 6PK STRL BLUE (TOWEL DISPOSABLE) ×4 IMPLANT
TOWEL OR 17X26 10 PK STRL BLUE (TOWEL DISPOSABLE) ×4 IMPLANT
TRAY LAPAROSCOPIC MC (CUSTOM PROCEDURE TRAY) ×4 IMPLANT
TROCAR XCEL NON-BLD 11X100MML (ENDOMECHANICALS) ×4 IMPLANT
TROCAR XCEL NON-BLD 5MMX100MML (ENDOMECHANICALS) ×4 IMPLANT
TUBING INSUFFLATION (TUBING) ×4 IMPLANT
WATER STERILE IRR 1000ML POUR (IV SOLUTION) ×4 IMPLANT

## 2017-11-06 NOTE — ED Triage Notes (Addendum)
Patient arrived with [facility] from [facility] ER at [facility] accepted by Dr. Kathrynn Humble , diagnosed with ruptured gallbladder - intermittent epigastric/RUQ pain for several months worse yesterday with nausea and emesis . She was treated with Zosyn 3.325 grams IV .

## 2017-11-06 NOTE — ED Provider Notes (Signed)
[facility] DEPART[facility] Provider Note   CSN: [id number] Arrival date & time: 11/06/17  0609     History   Chief Complaint Chief Complaint  Patient presents with  . Perforated Gallbladder    [facility] Transfer    HPI [name] is a 37 y.o. female.  The history is provided by the patient and medical records. No language interpreter was used.   [name] is a 37 y.o. female  with a PMH of HTN who presents to the Emergency Department complaining of epigastric abdominal pain that began last night. She took Aleve with some relief in pain. Associated with nausea and multiple episodes of vomiting. Denies fever, chills, diarrhea, constipation. She went to outside ED in [place], [place] where she had CT scan showing findings concerning for acute cholecystitis. Also a discontinuity of the fundal wall with localized inflammation, worrisome for perforation. NPO since about 7 pm last night. Given pain meds, nausea meds and zosyn prior to transfer.   Past Medical History:  Diagnosis Date  . Hypertension     There are no active problems to display for this patient.   History reviewed. No pertinent surgical history.  OB History    No data available       Home Medications    Prior to Admission medications   Medication Sig Start Date End Date Taking? Authorizing Provider  ondansetron (ZOFRAN) 4 MG tablet Take 1 tablet (4 mg total) by mouth every 8 (eight) hours as needed for nausea or vomiting. 10/01/15   Mackuen, Fredia Sorrow, MD    Family History No family history on file.  Social History Social History   Tobacco Use  . Smoking status: Never Smoker  . Smokeless tobacco: Never Used  Substance Use Topics  . Alcohol use: No    Frequency: Never  . Drug use: No     Allergies   Patient has no known allergies.   Review of Systems Review of Systems  Gastrointestinal: Positive for abdominal pain, nausea and vomiting.  All other  systems reviewed and are negative.    Physical Exam Updated Vital Signs BP 93/80 (BP Location: Left Arm)   Pulse 96   Temp 98.3 F (36.8 C) (Oral)   Resp 18   LMP 10/31/2017   SpO2 100%   Physical Exam  Constitutional: She is oriented to person, place, and time. She appears well-developed and well-nourished. No distress.  HENT:  Head: Normocephalic and atraumatic.  Cardiovascular: Normal rate, regular rhythm and normal heart sounds.  No murmur heard. Pulmonary/Chest: Effort normal and breath sounds normal. No respiratory distress.  Abdominal: Soft. She exhibits no distension. There is tenderness (Epigastric).  Musculoskeletal: She exhibits no edema.  Neurological: She is alert and oriented to person, place, and time.  Skin: Skin is warm and dry.  Nursing note and vitals reviewed.    ED Treatments / Results  Labs (all labs ordered are listed, but only abnormal results are displayed) Labs Reviewed  CBC WITH DIFFERENTIAL/PLATELET  COMPREHENSIVE METABOLIC PANEL  PREGNANCY, URINE  LIPASE, BLOOD    EKG  EKG Interpretation None       Radiology No results found.  Procedures Procedures (including critical care time)  Medications Ordered in ED Medications - No data to display   Initial Impression / Assessment and Plan / ED Course  I have reviewed the triage vital signs and the nursing notes.  Pertinent labs & imaging results that were available during my care of the patient were  reviewed by me and considered in my medical decision making (see chart for details).    [name] is a 37 y.o. female who presents to ED as transfer from outside hospital for CT findings concerning for acute cholecystitis. Patient stable upon arrival with pain / nausea controlled. Zosyn given prior to transfer. General surgery consulted who will admit.    Final Clinical Impressions(s) / ED Diagnoses   Final diagnoses:  Acute cholecystitis    ED Discharge Orders    None         Ward, Ozella Almond, PA-C 11/06/17 Dale, Ankit, MD 11/06/17 [phone]

## 2017-11-06 NOTE — Transfer of Care (Signed)
Immediate Anesthesia Transfer of Care Note  Patient: [name]  Procedure(s) Performed: LAPAROSCOPIC CHOLECYSTECTOMY (N/A Abdomen)  Patient Location: PACU  Anesthesia Type:General  Level of Consciousness: drowsy and patient cooperative  Airway & Oxygen Therapy: Patient Spontanous Breathing and Patient connected to face mask oxygen  Post-op Assessment: Report given to RN and Post -op Vital signs reviewed and stable  Post vital signs: Reviewed and stable  Last Vitals:  Vitals:   11/06/17 0815 11/06/17 0830  BP: 99/60 (!) 99/58  Pulse: 87 88  Resp:    Temp:    SpO2: 99% 99%    Last Pain:  Vitals:   11/06/17 0854  TempSrc:   PainSc: 3          Complications: No apparent anesthesia complications

## 2017-11-06 NOTE — Anesthesia Preprocedure Evaluation (Addendum)
Anesthesia Evaluation  Patient identified by MRN, date of birth, ID band Patient awake    Reviewed: Allergy & Precautions, NPO status , Patient's Chart, lab work & pertinent test results  Airway Mallampati: I  TM Distance: >3 FB Neck ROM: Full    Dental no notable dental hx.    Pulmonary neg pulmonary ROS,    Pulmonary exam normal        Cardiovascular hypertension, Pt. on medications negative cardio ROS Normal cardiovascular exam Rhythm:Regular Rate:Normal     Neuro/Psych negative neurological ROS  negative psych ROS   GI/Hepatic negative GI ROS, Neg liver ROS,   Endo/Other  negative endocrine ROS  Renal/GU negative Renal ROS  negative genitourinary   Musculoskeletal negative musculoskeletal ROS (+)   Abdominal   Peds  Hematology negative hematology ROS (+)   Anesthesia Other Findings   Reproductive/Obstetrics                            Anesthesia Physical Anesthesia Plan  ASA: II  Anesthesia Plan: General   Post-op Pain Management:    Induction: Intravenous  PONV Risk Score and Plan: 3 and Ondansetron, Dexamethasone, Midazolam and Treatment may vary due to age or medical condition  Airway Management Planned: Oral ETT  Additional Equipment:   Intra-op Plan:   Post-operative Plan: Extubation in OR  Informed Consent: I have reviewed the patients History and Physical, chart, labs and discussed the procedure including the risks, benefits and alternatives for the proposed anesthesia with the patient or authorized representative who has indicated his/her understanding and acceptance.   Dental advisory given  Plan Discussed with: CRNA and Surgeon  Anesthesia Plan Comments:        Anesthesia Quick Evaluation

## 2017-11-06 NOTE — Anesthesia Postprocedure Evaluation (Signed)
Anesthesia Post Note  Patient: [name]  Procedure(s) Performed: LAPAROSCOPIC CHOLECYSTECTOMY (N/A Abdomen)     Patient location during evaluation: PACU Anesthesia Type: General Level of consciousness: awake and alert Pain management: pain level controlled Vital Signs Assessment: post-procedure vital signs reviewed and stable Respiratory status: spontaneous breathing, nonlabored ventilation, respiratory function stable and patient connected to nasal cannula oxygen Cardiovascular status: blood pressure returned to baseline and stable Postop Assessment: no apparent nausea or vomiting Anesthetic complications: no    Last Vitals:  Vitals:   11/06/17 1238 11/06/17 1253  BP: 117/70 117/68  Pulse: 80 80  Resp: 20 16  Temp:    SpO2: 95% 93%    Last Pain:  Vitals:   11/06/17 1305  TempSrc:   PainSc: 6                  Barnet Glasgow

## 2017-11-06 NOTE — H&P (Signed)
  CC: RUQ pain  HPI: [name] is an 37 y.o. female with hx of HTN well controlled on antihypertensive comes in from [facility] in [place] with 2d hx of worsening severe sharp RUQ pain. Pain does not radiate. Nothing makes it better or worse. No f/c/n/v. Has had numerous RUQ attacks of pain over the last couple of years.  She reports history of keloids  Labs here pending [place] labs - WBC and LFTs were normal   CT in [place] showed thick-walled gallbladder and pericholecystic inflammatory changes.  Findings were concerning for acute cholecystitis.  Also of note is apparent discontinuity of the fundal wall with localized inflammation, worrisome for perforation.  Past Medical History:  Diagnosis Date  . Hypertension     History reviewed. No pertinent surgical history.  No family history on file.  Social:  reports that  has never smoked. she has never used smokeless tobacco. She reports that she does not drink alcohol or use drugs.  Allergies: No Known Allergies  Medications: I have reviewed the patient's current medications.  No results found for this or any previous visit (from the past 48 hour(s)).  No results found.  ROS - all of the below systems have been reviewed with the patient and positives are indicated with bold text General: chills, fever or night sweats Eyes: blurry vision or double vision ENT: epistaxis or sore throat Allergy/Immunology: itchy/watery eyes or nasal congestion Hematologic/Lymphatic: bleeding problems, blood clots or swollen lymph nodes Endocrine: temperature intolerance or unexpected weight changes Breast: new or changing breast lumps or nipple discharge Resp: cough, shortness of breath, or wheezing CV: chest pain or dyspnea on exertion GI: as per HPI GU: dysuria, trouble voiding, or hematuria MSK: joint pain or joint stiffness Neuro: TIA or stroke symptoms Derm: pruritus and skin lesion changes Psych: anxiety and  depression  PE Blood pressure 93/80, pulse 96, temperature 98.3 F (36.8 C), temperature source Oral, resp. rate 18, last menstrual period 10/31/2017, SpO2 100 %. Constitutional: NAD; conversant; no deformities Eyes: Moist conjunctiva; no lid lag; anicteric; PERRL Neck: Trachea midline; no thyromegaly Lungs: Normal respiratory effort; no tactile fremitus CV: RRR; no palpable thrills; no pitting edema GI: Abd soft, ttp in MEG and RUQ MSK: Normal gait; no clubbing/cyanosis Psychiatric: Appropriate affect; alert and oriented x3 Lymphatic: No palpable cervical or axillary lymphadenopathy  A/P: [name] is an 37 y.o. female with acute cholecystitis and possible fundal perforation  -Admit to surgery -NPO, IVF, Zosyn -Labs here - CBC, CMP, pregnancy test, lipase -Upload CT for review The anatomy & physiology of hepatobiliary & pancreatic function was discussed.  The pathophysiology of gallbladder dysfunction was discussed.  Natural history risks without surgery was discussed. I explained laparoscopic techniques with possible need for an open approach.  Possible cholangiogram to evaluate the bilary tract was explained as well.    Risks such as bleeding, infection, abscess, leak, injury to other organs, need for further treatment, heart attack, death, and other risks were discussed.  Possibility that this will not correct all abdominal symptoms was explained.  Goals of post-operative recovery were discussed as well. Her questions were answered to her satisfaction, she voiced understanding and elected to proceed with surgery.  Sharon Mt. Dema Severin, M.D. [facility]

## 2017-11-06 NOTE — Anesthesia Procedure Notes (Signed)
Procedure Name: Intubation Date/Time: 11/06/2017 10:38 AM Performed by: Freddie Breech, CRNA Pre-anesthesia Checklist: Patient identified, Emergency Drugs available, Suction available and Patient being monitored Patient Re-evaluated:Patient Re-evaluated prior to induction Oxygen Delivery Method: Circle System Utilized Preoxygenation: Pre-oxygenation with 100% oxygen Induction Type: IV induction Ventilation: Mask ventilation without difficulty Laryngoscope Size: Mac and 3 Grade View: Grade II Tube type: Oral Tube size: 7.0 mm Number of attempts: 1 Airway Equipment and Method: Stylet and Oral airway Placement Confirmation: ETT inserted through vocal cords under direct vision,  positive ETCO2 and breath sounds checked- equal and bilateral Secured at: 21 cm Tube secured with: Tape Dental Injury: Teeth and Oropharynx as per pre-operative assessment

## 2017-11-06 NOTE — ED Notes (Signed)
Report called to short stay, to take to [facility]

## 2017-11-06 NOTE — Op Note (Signed)
11/06/2017  11:25 AM  PATIENT:  [name]  37 y.o. female  PRE-OPERATIVE DIAGNOSIS:  acute cholecystitis  POST-OPERATIVE DIAGNOSIS:  acute purulent, perforated,cholecystitis  PROCEDURE:  Procedure(s): LAPAROSCOPIC CHOLECYSTECTOMY (N/A)  SURGEON:  Surgeon(s) and Role:    Ralene Ok, MD - Primary  ANESTHESIA:   local and general  EBL:  minimal   BLOOD ADMINISTERED:none  DRAINS: none   LOCAL MEDICATIONS USED:  BUPIVICAINE   SPECIMEN:  Source of Specimen:  gallbladder  DISPOSITION OF SPECIMEN:  PATHOLOGY  COUNTS:  YES  TOURNIQUET:  * No tourniquets in log *  DICTATION: .Dragon Dictation The patient was taken to the operating and placed in the supine position with bilateral SCDs in place. The patient was prepped and draped in the usual sterile fashion. A time out was called and all facts were verified. A pneumoperitoneum was obtained via A Veress needle technique to a pressure of 65mm of mercury.  A 47mm trochar was then placed in the right upper quadrant under visualization, and there were no injuries to any abdominal organs. A 11 mm port was then placed in the umbilical region after infiltrating with local anesthesia under direct visualization. A second and third epigastric port and right lower quadrant port placement under direct visualization, respectively.  The gallbladder was identified and seen to be adhered to surrounding omentum.  Once the omentum was cleared away, it could easily be seen that there was a perforation to the dome of the gallbladder with purulence.  There was no free bile ascites  The gallbladder was retracted, the peritoneum was then sharply dissected from the gallbladder and this dissection was carried down to Calot's triangle. The gallbladder was identified and stripped away circumferentially and seen going into the gallbladder 360, the critical angle was obtained.  2 clips were placed proximally one distally and the cystic duct  transected. The cystic artery was identified and 2 clips placed proximally and one distally and transected. We then proceeded to remove the gallbladder off the hepatic fossa with Bovie cautery. A retrieval bag was then placed in the abdomen and gallbladder placed in the bag. The hepatic fossa was then reexamined and hemostasis was achieved with Bovie cautery and was excellent at the end of the case. The subhepatic fossa and perihepatic fossa was then irrigated until the effluent was clear.  The gallbladder and bag were removed from the abdominal cavity. The 11 mm trocar fascia was reapproximated with the Endo Close #1 Vicrylx2. The pneumoperitoneum was evacuated and all trochars removed under direct visulalization. The skin was then closed with 4-0 Monocryl and the skin dressed with Steri-Strips, gauze, and tape. The patient was awaken from general anesthesia and taken to the recovery room in stable condition.   PLAN OF CARE: Admit for overnight observation  PATIENT DISPOSITION:  PACU - hemodynamically stable.   Delay start of Pharmacological VTE agent (>24hrs) due to surgical blood loss or risk of bleeding: not applicable

## 2017-11-07 ENCOUNTER — Encounter (HOSPITAL_COMMUNITY): Payer: Self-pay | Admitting: General Surgery

## 2017-11-07 LAB — COMPREHENSIVE METABOLIC PANEL
ALBUMIN: 2.6 g/dL — AB (ref 3.5–5.0)
ALT: 29 U/L (ref 14–54)
AST: 27 U/L (ref 15–41)
Alkaline Phosphatase: 45 U/L (ref 38–126)
Anion gap: 8 (ref 5–15)
BUN: 5 mg/dL — AB (ref 6–20)
CHLORIDE: 104 mmol/L (ref 101–111)
CO2: 26 mmol/L (ref 22–32)
Calcium: 8.4 mg/dL — ABNORMAL LOW (ref 8.9–10.3)
Creatinine, Ser: 0.95 mg/dL (ref 0.44–1.00)
GFR calc non Af Amer: >60 mL/min (ref >60)
GLUCOSE: 131 mg/dL — AB (ref 65–99)
Potassium: 3.3 mmol/L — ABNORMAL LOW (ref 3.5–5.1)
SODIUM: 138 mmol/L (ref 135–145)
Total Bilirubin: 0.5 mg/dL (ref 0.3–1.2)
Total Protein: 5.7 g/dL — ABNORMAL LOW (ref 6.5–8.1)

## 2017-11-07 LAB — HIV ANTIBODY (ROUTINE TESTING W REFLEX): HIV Screen 4th Generation wRfx: NONREACTIVE

## 2017-11-07 MED ORDER — ACETAMINOPHEN 325 MG PO TABS: ORAL_TABLET | ORAL | Status: DC

## 2017-11-07 MED ORDER — OXYCODONE HCL 10 MG PO TABS: ORAL_TABLET | ORAL | 0 refills | Status: DC

## 2017-11-07 MED ORDER — ACETAMINOPHEN 325 MG PO TABS: 650.0000 mg | ORAL_TABLET | Freq: Four times a day (QID) | ORAL | Status: DC | PRN

## 2017-11-07 MED ORDER — IBUPROFEN 200 MG PO TABS: ORAL_TABLET | ORAL | Status: DC

## 2017-11-07 MED ORDER — IBUPROFEN 600 MG PO TABS: 600.0000 mg | ORAL_TABLET | Freq: Four times a day (QID) | ORAL | Status: DC | PRN

## 2017-11-07 MED ORDER — AMOXICILLIN-POT CLAVULANATE 875-125 MG PO TABS
1.0000 | ORAL_TABLET | Freq: Two times a day (BID) | ORAL | Status: DC
  Administered 2017-11-07: 1 via ORAL
  Filled 2017-11-07: qty 1

## 2017-11-07 MED ORDER — AMOXICILLIN-POT CLAVULANATE 875-125 MG PO TABS: 1.0000 | ORAL_TABLET | Freq: Two times a day (BID) | ORAL | 0 refills | Status: DC

## 2017-11-07 NOTE — Discharge Summary (Signed)
Physician Discharge Summary  Patient ID: [name] MRN: [id number] DOB/AGE: 38-Oct-1982 38 y.o.  Admit date: 11/06/2017 Discharge date: 11/07/2017  Admission Diagnoses:  Acute cholecystitis Hypertension Chronic constipation  Discharge Diagnoses:  Acute purulent perforated cholecystitis Hypertension Constipation  Active Problems:   Acute cholecystitis   PROCEDURES: *Laparoscopic cholecystectomy, 11/06/17, Dr. Ralene Ok.  Hospital Course:  [name] is an 37 y.o. female with hx of HTN well controlled on antihypertensive comes in from [facility] in [place] with 2d hx of worsening severe sharp RUQ pain. Pain does not radiate. Nothing makes it better or worse. No f/c/n/v. Has had numerous RUQ attacks of pain over the last couple of years.  She reports history of keloids  Patient was seen in the emergency department and then admitted by Dr. Dema Severin.  She was seen the following a.m. and taken to the operating room by Dr. Ralene Ok.  He tolerated the procedure well and returned to the floor.  She was placed on oral antibiotics for discharge for another 72 hours.  Her diet was advanced she was mobilized she was p.o. pain medication and was ready for discharge the first postoperative morning. Follow-up will be in the [facility] in 2 weeks. Condition on discharge: Improved.  CBC Latest Ref Rng & Units 11/06/2017 11/06/2017 10/01/2015  WBC 4.0 - 10.5 K/uL 13.7(H) 10.0 9.2  Hemoglobin 12.0 - 15.0 g/dL 13.2 13.2 13.3  Hematocrit 36.0 - 46.0 % 39.2 39.4 40.2  Platelets 150 - 400 K/uL 279 243 312   CMP Latest Ref Rng & Units 11/07/2017 11/06/2017 11/06/2017  Glucose 65 - 99 mg/dL 131(H) - 113(H)  BUN 6 - 20 mg/dL 5(L) - 7  Creatinine 0.44 - 1.00 mg/dL 0.95 0.89 0.94  Sodium 135 - 145 mmol/L 138 - 139  Potassium 3.5 - 5.1 mmol/L 3.3(L) - 2.8(L)  Chloride 101 - 111 mmol/L 104 - 107  CO2 22 - 32 mmol/L 26 - 24  Calcium 8.9 - 10.3 mg/dL 8.4(L) - 8.5(L)  Total  Protein 6.5 - 8.1 g/dL 5.7(L) - 6.4(L)  Total Bilirubin 0.3 - 1.2 mg/dL 0.5 - 0.9  Alkaline Phos 38 - 126 U/L 45 - 50  AST 15 - 41 U/L 27 - 21  ALT 14 - 54 U/L 29 - 15    Disposition: 01-Home or Self Care   Allergies as of 11/07/2017   No Known Allergies     Medication List    TAKE these medications   acetaminophen 325 MG tablet Commonly known as:  TYLENOL You can take 2 tablets every 4 hours as needed for pain.  You can alternate this with ibuprofen or oxycodone.  You can buy this over-the-counter at any drugstore. Do not take more than 4000 mg of Tylenol a day.  It can harm your liver.   amoxicillin-clavulanate 875-125 MG tablet Commonly known as:  AUGMENTIN Take 1 tablet by mouth every 12 (twelve) hours.   ibuprofen 200 MG tablet Commonly known as:  ADVIL,MOTRIN You can take 2-3 tablets every 6 hours as needed for pain.  You can alternate this with plain Tylenol or oxycodone.  You can buy this over-the-counter at any drugstore.   lubiprostone 24 MCG capsule Commonly known as:  AMITIZA Take 24 mcg by mouth daily as needed for constipation.   norethindrone 0.35 MG tablet Commonly known as:  MICRONOR,CAMILA,ERRIN Take 1 tablet by mouth daily.   olmesartan 40 MG tablet Commonly known as:  BENICAR Take 40 mg by mouth daily.   ondansetron  4 MG tablet Commonly known as:  ZOFRAN Take 1 tablet (4 mg total) by mouth every 8 (eight) hours as needed for nausea or vomiting.   Oxycodone HCl 10 MG Tabs You can take 1 or 2 tablets every 6 hours as needed for pain not relieved by plain Tylenol or ibuprofen.      Follow-up Information    [facility] Follow up on 11/16/2017.   Specialty:  General Surgery Why:  Your appointment is at 4 PM, be at the office 30 minutes early for check in.  Bring photo ID and insurance information with you.   Contact information: [address] [place] [address] [phone]            Signed: Earnstine Regal 11/07/2017, 3:35 PM

## 2017-11-07 NOTE — Progress Notes (Signed)
1 Day Post-Op    CC:  cholecystitis  Subjective: Doing well this AM.  She has problems with chronic constipation ask about bowel movements postop.  I recommended she put herself on a fiber and continue her current bowel regime.  She can adjusted as needed once she gets home and start having regular bowel movements.  Her sites are clean and dry.  She is tolerating soft diet.  Anticipate discharge home later this a.m.  We discussed ongoing antibiotics for the next 72 hours.  Objective: Vital signs in last 24 hours: Temp:  [98 F (36.7 C)-99.4 F (37.4 C)] 98 F (36.7 C) (01/22 0634) Pulse Rate:  [60-88] 60 (01/22 0634) Resp:  [12-28] 18 (01/22 0634) BP: (99-118)/(58-72) 111/62 (01/22 0634) SpO2:  [93 %-100 %] 95 % (01/22 0634) Weight:  [113.9 kg (251 lb 1.7 oz)] 113.9 kg (251 lb 1.7 oz) (01/21 1800) Last BM Date: 11/04/17 480 Po 2700 IV Voided x 2 Afebrile, VSS No labs   Intake/Output from previous day: 01/21 0701 - 01/22 0700 In: 3370 [P.O.:480; I.V.:2715; IV Piggyback:100] Out: 20 [Blood:20] Intake/Output this shift: No intake/output data recorded.  General appearance: alert, cooperative and no distress Resp: clear to auscultation bilaterally GI: Soft, sore, port sites are dry.  Tolerated diet well.  Lab Results:  Recent Labs    11/06/17 0657 11/06/17 1334  WBC 10.0 13.7*  HGB 13.2 13.2  HCT 39.4 39.2  PLT 243 279    BMET Recent Labs    11/06/17 0657 11/06/17 1334  NA 139  --   K 2.8*  --   CL 107  --   CO2 24  --   GLUCOSE 113*  --   BUN 7  --   CREATININE 0.94 0.89  CALCIUM 8.5*  --    PT/INR No results for input(s): LABPROT, INR in the last 72 hours.  Recent Labs  Lab 11/06/17 0657  AST 21  ALT 15  ALKPHOS 50  BILITOT 0.9  PROT 6.4*  ALBUMIN 3.1*     Lipase     Component Value Date/Time   LIPASE 21 11/06/2017 0657     Prior to Admission medications   Medication Sig Start Date End Date Taking? Authorizing Provider  lubiprostone  (AMITIZA) 24 MCG capsule Take 24 mcg by mouth daily as needed for constipation.   Yes [provider]  norethindrone (MICRONOR,CAMILA,ERRIN) 0.35 MG tablet Take 1 tablet by mouth daily.   Yes [provider]  olmesartan (BENICAR) 40 MG tablet Take 40 mg by mouth daily.   Yes [provider]  ondansetron (ZOFRAN) 4 MG tablet Take 1 tablet (4 mg total) by mouth every 8 (eight) hours as needed for nausea or vomiting. 10/01/15  Yes Mackuen, Courteney Lyn, MD   . acetaminophen    . dextrose 5 % and 0.9% NaCl 100 mL/hr at 11/07/17 0544  . lactated ringers    . lactated ringers 10 mL/hr at 11/06/17 0957   Medications: . amoxicillin-clavulanate  1 tablet Oral Q12H  . docusate sodium  200 mg Oral BID  . heparin  5,000 Units Subcutaneous Q8H    Assessment/Plan Acute purulent perforated cholecystitis. Laparoscopic cholecystectomy, 11/06/17, Dr. Ralene Ok  Hypertension Constipation FEN:  Clears ID:  None DVT:  Heparin Foley:  None Follow up:  [facility]  Plan: Home later today on 3 days of p.o. Augmentin follow-up in the [facility] clinic.  I have personally reviewed the patients medication history on the [place] controlled substance database.  LOS: 1 day    Beatriz Settles 11/07/2017 [phone]

## 2017-11-07 NOTE — Progress Notes (Signed)
1400 Patient discharged to home. Patient verbalizes understanding of all discharge instructions including incision care, discharge medications, and follow up MD visits. Patient accompanied by family.

## 2017-11-07 NOTE — Discharge Instructions (Signed)
[facility] ______[facility] LAPAROSCOPIC SURGERY: POST OP INSTRUCTIONS Take off the outside dressing tomorrow, you can shower.  If the Steri-Strips do not come off in a week you can remove them. Always review your discharge instruction sheet given to you by the facility where your surgery was performed. IF YOU HAVE DISABILITY OR FAMILY LEAVE FORMS, YOU MUST BRING THEM TO THE OFFICE FOR PROCESSING.   DO NOT GIVE THEM TO YOUR DOCTOR.  1. A prescription for pain medication may be given to you upon discharge.  Take your pain medication as prescribed, if needed.  If narcotic pain medicine is not needed, then you may take acetaminophen (Tylenol) or ibuprofen (Advil) as needed. 2. Take your usually prescribed medications unless otherwise directed. 3. If you need a refill on your pain medication, please contact your pharmacy.  They will contact our office to request authorization. Prescriptions will not be filled after 5pm or on week-ends. 4. You should follow a light diet the first few days after arrival home, such as soup and crackers, etc.  Be sure to include lots of fluids daily. 5. Most patients will experience some swelling and bruising in the area of the incisions.  Ice packs will help.  Swelling and bruising can take several days to resolve.  6. It is common to experience some constipation if taking pain medication after surgery.  Increasing fluid intake and taking a stool softener (such as Colace) will usually help or prevent this problem from occurring.  A mild laxative (Milk of Magnesia or Miralax) should be taken according to package instructions if there are no bowel movements after 48 hours. 7. Unless discharge instructions indicate otherwise, you may remove your bandages 24-48 hours after surgery, and you may shower at that time.  You may have steri-strips (small skin tapes) in place directly over the incision.  These strips should be left on the skin for 7-10 days.  If your surgeon  used skin glue on the incision, you may shower in 24 hours.  The glue will flake off over the next 2-3 weeks.  Any sutures or staples will be removed at the office during your follow-up visit. 8. ACTIVITIES:  You may resume regular (light) daily activities beginning the next day--such as daily self-care, walking, climbing stairs--gradually increasing activities as tolerated.  You may have sexual intercourse when it is comfortable.  Refrain from any heavy lifting or straining until approved by your doctor. a. You may drive when you are no longer taking prescription pain medication, you can comfortably wear a seatbelt, and you can safely maneuver your car and apply brakes. b. RETURN TO WORK:  __________________________________________________________ 9. You should see your doctor in the office for a follow-up appointment approximately 2-3 weeks after your surgery.  Make sure that you call for this appointment within a day or two after you arrive home to insure a convenient appointment time. 10. OTHER INSTRUCTIONS: __________________________________________________________________________________________________________________________ __________________________________________________________________________________________________________________________ WHEN TO CALL YOUR DOCTOR: 1. Fever over 101.0 2. Inability to urinate 3. Continued bleeding from incision. 4. Increased pain, redness, or drainage from the incision. 5. Increasing abdominal pain  The clinic staff is available to answer your questions during regular business hours.  Please dont hesitate to call and ask to speak to one of the nurses for clinical concerns.  If you have a medical emergency, go to the nearest emergency room or call 911.  A surgeon from [facility] is always on call at the hospital. [address] [employer]  [address] [place], [place]  [address] ? [address], [place], [place]   [address] [phone] ?  [phone] ? FAX [phone] Web site: [email or web address]    Laparoscopic Cholecystectomy, Care After This sheet gives you information about how to care for yourself after your procedure. Your health care provider may also give you more specific instructions. If you have problems or questions, contact your health care provider. What can I expect after the procedure? After the procedure, it is common to have:  Pain at your incision sites. You will be given medicines to control this pain.  Mild nausea or vomiting.  Bloating and possible shoulder pain from the air-like gas that was used during the procedure.  Follow these instructions at home: Incision care   Follow instructions from your health care provider about how to take care of your incisions. Make sure you: ? Wash your hands with soap and water before you change your bandage (dressing). If soap and water are not available, use hand sanitizer. ? Change your dressing as told by your health care provider. ? Leave stitches (sutures), skin glue, or adhesive strips in place. These skin closures may need to be in place for 2 weeks or longer. If adhesive strip edges start to loosen and curl up, you may trim the loose edges. Do not remove adhesive strips completely unless your health care provider tells you to do that.  Do not take baths, swim, or use a hot tub until your health care provider approves. Ask your health care provider if you can take showers. You may only be allowed to take sponge baths for bathing.  Check your incision area every day for signs of infection. Check for: ? More redness, swelling, or pain. ? More fluid or blood. ? Warmth. ? Pus or a bad smell. Activity  Do not drive or use heavy machinery while taking prescription pain medicine.  Do not lift anything that is heavier than 10 lb (4.5 kg) until your health care provider approves.  Do not play contact sports until your health care provider  approves.  Do not drive for 24 hours if you were given a medicine to help you relax (sedative).  Rest as needed. Do not return to work or school until your health care provider approves. General instructions  Take over-the-counter and prescription medicines only as told by your health care provider.  To prevent or treat constipation while you are taking prescription pain medicine, your health care provider may recommend that you: ? Drink enough fluid to keep your urine clear or pale yellow. ? Take over-the-counter or prescription medicines. ? Eat foods that are high in fiber, such as fresh fruits and vegetables, whole grains, and beans. ? Limit foods that are high in fat and processed sugars, such as fried and sweet foods. Contact a health care provider if:  You develop a rash.  You have more redness, swelling, or pain around your incisions.  You have more fluid or blood coming from your incisions.  Your incisions feel warm to the touch.  You have pus or a bad smell coming from your incisions.  You have a fever.  One or more of your incisions breaks open. Get help right away if:  You have trouble breathing.  You have chest pain.  You have increasing pain in your shoulders.  You faint or feel dizzy when you stand.  You have severe pain in your abdomen.  You have nausea or vomiting that lasts for more than one  day.  You have leg pain. This information is not intended to replace advice given to you by your health care provider. Make sure you discuss any questions you have with your health care provider. Document Released: 10/03/2005 Document Revised: 04/23/2016 Document Reviewed: 03/21/2016 Elsevier Interactive Patient Education  2018 [employer].

## 2017-11-24 ENCOUNTER — Other Ambulatory Visit: Payer: Self-pay | Admitting: Family Medicine

## 2017-11-24 DIAGNOSIS — R109 Unspecified abdominal pain: Secondary | ICD-10-CM

## 2021-01-13 ENCOUNTER — Other Ambulatory Visit: Payer: Self-pay | Admitting: Obstetrics and Gynecology

## 2021-02-23 NOTE — Progress Notes (Signed)
Surgical Instructions    Your procedure is scheduled on 02/26/21.  Report to [facility] Main Entrance "A" at 09:30 A.M., then check in with the Admitting office.  Call this number if you have problems the morning of surgery:  [phone]   If you have any questions prior to your surgery date call [phone]: Open Monday-Friday 8am-4pm    Remember:  Do not eat or drink after midnight the night before your surgery      Take these medicines the morning of surgery with A SIP OF WATER  norethindrone   As of today, STOP taking any Aspirin (unless otherwise instructed by your surgeon) Aleve, Naproxen, Ibuprofen, Motrin, Advil, Goody's, BC's, all herbal medications, fish oil, diclofenac Sodium (VOLTAREN) and all vitamins.                     Do not wear jewelry, make up, or nail polish            Do not wear lotions, powders, perfumes, or deodorant.            Do not shave 48 hours prior to surgery.              Do not bring valuables to the hospital.            [facility] is not responsible for any belongings or valuables.  Do NOT Smoke (Tobacco/Vaping) or drink Alcohol 24 hours prior to your procedure If you use a CPAP at night, you may bring all equipment for your overnight stay.   Contacts, glasses, dentures or bridgework may not be worn into surgery, please bring cases for these belongings   For patients admitted to the hospital, discharge time will be determined by your treatment team.   Patients discharged the day of surgery will not be allowed to drive home, and someone needs to stay with them for 24 hours.    Special instructions:   [place]- Preparing For Surgery  Before surgery, you can play an important role. Because skin is not sterile, your skin needs to be as free of germs as possible. You can reduce the number of germs on your skin by washing with CHG (chlorahexidine gluconate) Soap before surgery.  CHG is an antiseptic cleaner which kills germs and bonds with  the skin to continue killing germs even after washing.    Oral Hygiene is also important to reduce your risk of infection.  Remember - BRUSH YOUR TEETH THE MORNING OF SURGERY WITH YOUR REGULAR TOOTHPASTE  Please do not use if you have an allergy to CHG or antibacterial soaps. If your skin becomes reddened/irritated stop using the CHG.  Do not shave (including legs and underarms) for at least 48 hours prior to first CHG shower. It is OK to shave your face.  Please follow these instructions carefully.   1. Shower the NIGHT BEFORE SURGERY and the MORNING OF SURGERY  2. If you chose to wash your hair, wash your hair first as usual with your normal shampoo.  3. After you shampoo, rinse your hair and body thoroughly to remove the shampoo.  4. Wash Face and genitals (private parts) with your normal soap.   5.  Shower the NIGHT BEFORE SURGERY and the MORNING OF SURGERY with CHG Soap.   6. Use CHG Soap as you would any other liquid soap. You can apply CHG directly to the skin and wash gently with a scrungie or a clean washcloth.   7. Apply  the CHG Soap to your body ONLY FROM THE NECK DOWN.  Do not use on open wounds or open sores. Avoid contact with your eyes, ears, mouth and genitals (private parts). Wash Face and genitals (private parts)  with your normal soap.   8. Wash thoroughly, paying special attention to the area where your surgery will be performed.  9. Thoroughly rinse your body with warm water from the neck down.  10. DO NOT shower/wash with your normal soap after using and rinsing off the CHG Soap.  11. Pat yourself dry with a CLEAN TOWEL.  12. Wear CLEAN PAJAMAS to bed the night before surgery  13. Place CLEAN SHEETS on your bed the night before your surgery  14. DO NOT SLEEP WITH PETS.   Day of Surgery: Take a shower with CHG soap. Wear Clean/Comfortable clothing the morning of surgery Do not apply any deodorants/lotions.   Remember to brush your teeth WITH YOUR REGULAR  TOOTHPASTE.   Please read over the following fact sheets that you were given.

## 2021-02-24 ENCOUNTER — Other Ambulatory Visit: Payer: Self-pay

## 2021-02-24 ENCOUNTER — Encounter (HOSPITAL_COMMUNITY): Payer: Self-pay

## 2021-02-24 ENCOUNTER — Encounter (HOSPITAL_COMMUNITY)
Admission: RE | Admit: 2021-02-24 | Discharge: 2021-02-24 | Disposition: A | Discharge status: Home / Self Care | Payer: BC Managed Care – PPO | Source: Ambulatory Visit | Attending: Obstetrics and Gynecology | Admitting: Obstetrics and Gynecology

## 2021-02-24 DIAGNOSIS — Z20822 Contact with and (suspected) exposure to covid-19: Secondary | ICD-10-CM | POA: Insufficient documentation

## 2021-02-24 DIAGNOSIS — Z01818 Encounter for other preprocedural examination: Secondary | ICD-10-CM | POA: Insufficient documentation

## 2021-02-24 HISTORY — DX: Nausea with vomiting, unspecified: R11.2

## 2021-02-24 HISTORY — DX: Other specified postprocedural states: Z98.890

## 2021-02-24 HISTORY — DX: Anemia, unspecified: D64.9

## 2021-02-24 HISTORY — DX: Unspecified osteoarthritis, unspecified site: M19.90

## 2021-02-24 LAB — BASIC METABOLIC PANEL
Anion gap: 5 (ref 5–15)
BUN: 12 mg/dL (ref 6–20)
CO2: 28 mmol/L (ref 22–32)
Calcium: 9 mg/dL (ref 8.9–10.3)
Chloride: 103 mmol/L (ref 98–111)
Creatinine, Ser: 0.87 mg/dL (ref 0.44–1.00)
GFR, Estimated: >60 mL/min (ref >60)
Glucose, Bld: 91 mg/dL (ref 70–99)
Potassium: 3.5 mmol/L (ref 3.5–5.1)
Sodium: 136 mmol/L (ref 135–145)

## 2021-02-24 LAB — CBC
HCT: 38.6 % (ref 36.0–46.0)
Hemoglobin: 11.8 g/dL — ABNORMAL LOW (ref 12.0–15.0)
MCH: 27.1 pg (ref 26.0–34.0)
MCHC: 30.6 g/dL (ref 30.0–36.0)
MCV: 88.5 fL (ref 80.0–100.0)
Platelets: 366 10*3/uL (ref 150–400)
RBC: 4.36 MIL/uL (ref 3.87–5.11)
RDW: 23.3 % — ABNORMAL HIGH (ref 11.5–15.5)
WBC: 5.1 10*3/uL (ref 4.0–10.5)
nRBC: 0 % (ref 0.0–0.2)

## 2021-02-24 LAB — SARS CORONAVIRUS 2 (TAT 6-24 HRS): SARS Coronavirus 2: NEGATIVE

## 2021-02-24 LAB — TYPE AND SCREEN
ABO/RH(D): O NEG
Antibody Screen: NEGATIVE

## 2021-02-24 NOTE — Progress Notes (Signed)
PCP - Rachell Cipro Cardiologist - denies  PPM/ICD - denies   Chest x-ray - n/a EKG - 02/24/21 Stress Test - denies ECHO - denies Cardiac Cath - denies  Sleep Study - 01/30/14- determined she did not have sleep apnea (no cpap)  No diabetes  As of today, STOP taking any Aspirin (unless otherwise instructed by your surgeon) Aleve, Naproxen, Ibuprofen, Motrin, Advil, Goody's, BC's, all herbal medications, fish oil, diclofenac Sodium (VOLTAREN) and all vitamins.   ERAS Protcol -no   COVID TEST- 02/24/21   Anesthesia review: no  Patient denies shortness of breath, fever, cough and chest pain at PAT appointment   All instructions explained to the patient, with a verbal understanding of the material. Patient agrees to go over the instructions while at home for a better understanding. Patient also instructed to self quarantine after being tested for COVID-19. The opportunity to ask questions was provided.

## 2021-02-26 ENCOUNTER — Inpatient Hospital Stay (HOSPITAL_COMMUNITY)
Admission: RE | Admit: 2021-02-26 | Discharge: 2021-02-27 | DRG: 742 | Disposition: A | Discharge status: Home / Self Care | Payer: BC Managed Care – PPO | Attending: Obstetrics and Gynecology | Admitting: Obstetrics and Gynecology

## 2021-02-26 ENCOUNTER — Encounter (HOSPITAL_COMMUNITY)
Admission: RE | Disposition: A | Discharge status: Home / Self Care | Payer: Self-pay | Source: Home / Self Care | Attending: Obstetrics and Gynecology

## 2021-02-26 ENCOUNTER — Inpatient Hospital Stay (HOSPITAL_COMMUNITY): Payer: BC Managed Care – PPO | Admitting: Anesthesiology

## 2021-02-26 ENCOUNTER — Encounter (HOSPITAL_COMMUNITY): Payer: Self-pay | Admitting: Obstetrics and Gynecology

## 2021-02-26 DIAGNOSIS — N920 Excessive and frequent menstruation with regular cycle: Secondary | ICD-10-CM | POA: Diagnosis present

## 2021-02-26 DIAGNOSIS — D259 Leiomyoma of uterus, unspecified: Secondary | ICD-10-CM | POA: Diagnosis present

## 2021-02-26 DIAGNOSIS — D250 Submucous leiomyoma of uterus: Principal | ICD-10-CM | POA: Diagnosis present

## 2021-02-26 DIAGNOSIS — D252 Subserosal leiomyoma of uterus: Secondary | ICD-10-CM | POA: Diagnosis present

## 2021-02-26 DIAGNOSIS — Z6841 Body Mass Index (BMI) 40.0 and over, adult: Secondary | ICD-10-CM | POA: Diagnosis not present

## 2021-02-26 DIAGNOSIS — D509 Iron deficiency anemia, unspecified: Secondary | ICD-10-CM | POA: Diagnosis present

## 2021-02-26 DIAGNOSIS — Z20822 Contact with and (suspected) exposure to covid-19: Secondary | ICD-10-CM | POA: Diagnosis present

## 2021-02-26 DIAGNOSIS — I10 Essential (primary) hypertension: Secondary | ICD-10-CM | POA: Diagnosis present

## 2021-02-26 DIAGNOSIS — Z9889 Other specified postprocedural states: Secondary | ICD-10-CM

## 2021-02-26 HISTORY — PX: MYOMECTOMY: SHX85

## 2021-02-26 LAB — POCT PREGNANCY, URINE: Preg Test, Ur: NEGATIVE

## 2021-02-26 LAB — ABO/RH: ABO/RH(D): O NEG

## 2021-02-26 SURGERY — MYOMECTOMY, ABDOMINAL APPROACH
Anesthesia: General | Site: Abdomen

## 2021-02-26 MED ORDER — ONDANSETRON HCL 4 MG/2ML IJ SOLN
INTRAMUSCULAR | Status: AC
  Filled 2021-02-26: qty 2

## 2021-02-26 MED ORDER — HYDROMORPHONE HCL 1 MG/ML IJ SOLN
INTRAMUSCULAR | Status: AC
  Filled 2021-02-26: qty 0.5

## 2021-02-26 MED ORDER — LACTATED RINGERS IV SOLN: INTRAVENOUS | Status: DC

## 2021-02-26 MED ORDER — FENTANYL CITRATE (PF) 250 MCG/5ML IJ SOLN
INTRAMUSCULAR | Status: AC
  Filled 2021-02-26: qty 5

## 2021-02-26 MED ORDER — DIPHENHYDRAMINE HCL 50 MG/ML IJ SOLN: 12.5000 mg | Freq: Four times a day (QID) | INTRAMUSCULAR | Status: DC | PRN

## 2021-02-26 MED ORDER — LIDOCAINE 2% (20 MG/ML) 5 ML SYRINGE
INTRAMUSCULAR | Status: AC
  Filled 2021-02-26: qty 5

## 2021-02-26 MED ORDER — PROPOFOL 10 MG/ML IV BOLUS: INTRAVENOUS | Status: DC | PRN

## 2021-02-26 MED ORDER — VASOPRESSIN 20 UNIT/ML IV SOLN
INTRAVENOUS | Status: AC
  Filled 2021-02-26: qty 3

## 2021-02-26 MED ORDER — MEPERIDINE HCL 25 MG/ML IJ SOLN: 6.2500 mg | INTRAMUSCULAR | Status: DC | PRN

## 2021-02-26 MED ORDER — LACTATED RINGERS IV SOLN: INTRAVENOUS | Status: DC | PRN

## 2021-02-26 MED ORDER — VASOPRESSIN 20 UNIT/ML IV SOLN
INTRAVENOUS | Status: DC | PRN
  Administered 2021-02-26: 47 mL via INTRAMUSCULAR

## 2021-02-26 MED ORDER — DEXAMETHASONE SODIUM PHOSPHATE 10 MG/ML IJ SOLN
INTRAMUSCULAR | Status: AC
  Filled 2021-02-26: qty 1

## 2021-02-26 MED ORDER — BUPIVACAINE HCL (PF) 0.25 % IJ SOLN
INTRAMUSCULAR | Status: AC
  Filled 2021-02-26: qty 30

## 2021-02-26 MED ORDER — SODIUM CHLORIDE 0.9% FLUSH: 9.0000 mL | INTRAVENOUS | Status: DC | PRN

## 2021-02-26 MED ORDER — IRBESARTAN 300 MG PO TABS
300.0000 mg | ORAL_TABLET | Freq: Every day | ORAL | Status: DC
  Administered 2021-02-26 – 2021-02-27 (×2): 300 mg via ORAL
  Filled 2021-02-26 (×2): qty 1

## 2021-02-26 MED ORDER — CEFAZOLIN SODIUM-DEXTROSE 2-3 GM-%(50ML) IV SOLR
INTRAVENOUS | Status: DC | PRN
  Administered 2021-02-26: 2 g via INTRAVENOUS

## 2021-02-26 MED ORDER — SENNOSIDES-DOCUSATE SODIUM 8.6-50 MG PO TABS: 1.0000 | ORAL_TABLET | Freq: Every evening | ORAL | Status: DC | PRN

## 2021-02-26 MED ORDER — BUPIVACAINE HCL (PF) 0.25 % IJ SOLN
INTRAMUSCULAR | Status: DC | PRN
  Administered 2021-02-26: 10 mL

## 2021-02-26 MED ORDER — HYDROMORPHONE 1 MG/ML IV SOLN
INTRAVENOUS | Status: AC
  Filled 2021-02-26: qty 30

## 2021-02-26 MED ORDER — HYDROMORPHONE 1 MG/ML IV SOLN
INTRAVENOUS | Status: DC
Start: 2021-02-26 — End: 2021-02-26
  Administered 2021-02-26: 30 mg via INTRAVENOUS

## 2021-02-26 MED ORDER — PHENYLEPHRINE HCL (PRESSORS) 10 MG/ML IV SOLN
INTRAVENOUS | Status: DC | PRN
  Administered 2021-02-26: 80 ug via INTRAVENOUS

## 2021-02-26 MED ORDER — ROCURONIUM BROMIDE 100 MG/10ML IV SOLN: INTRAVENOUS | Status: DC | PRN

## 2021-02-26 MED ORDER — PHENYLEPHRINE 40 MCG/ML (10ML) SYRINGE FOR IV PUSH (FOR BLOOD PRESSURE SUPPORT)
PREFILLED_SYRINGE | INTRAVENOUS | Status: AC
  Filled 2021-02-26: qty 10

## 2021-02-26 MED ORDER — 0.9 % SODIUM CHLORIDE (POUR BTL) OPTIME
TOPICAL | Status: DC | PRN
  Administered 2021-02-26: 1000 mL

## 2021-02-26 MED ORDER — LIDOCAINE HCL (CARDIAC) PF 100 MG/5ML IV SOSY: PREFILLED_SYRINGE | INTRAVENOUS | Status: DC | PRN

## 2021-02-26 MED ORDER — ONDANSETRON HCL 4 MG/2ML IJ SOLN: 4.0000 mg | Freq: Four times a day (QID) | INTRAMUSCULAR | Status: DC | PRN

## 2021-02-26 MED ORDER — MENTHOL 3 MG MT LOZG: 1.0000 | LOZENGE | OROMUCOSAL | Status: DC | PRN

## 2021-02-26 MED ORDER — HYDROMORPHONE HCL 1 MG/ML IJ SOLN
INTRAMUSCULAR | Status: DC | PRN
  Administered 2021-02-26 (×2): .5 mg via INTRAVENOUS

## 2021-02-26 MED ORDER — SODIUM CHLORIDE (PF) 0.9 % IJ SOLN
INTRAMUSCULAR | Status: AC
  Filled 2021-02-26: qty 100

## 2021-02-26 MED ORDER — KETOROLAC TROMETHAMINE 30 MG/ML IJ SOLN
30.0000 mg | Freq: Four times a day (QID) | INTRAMUSCULAR | Status: DC
  Administered 2021-02-27 (×2): 30 mg via INTRAVENOUS
  Filled 2021-02-26 (×3): qty 1

## 2021-02-26 MED ORDER — MIDAZOLAM HCL 2 MG/2ML IJ SOLN
INTRAMUSCULAR | Status: DC | PRN
  Administered 2021-02-26: 2 mg via INTRAVENOUS

## 2021-02-26 MED ORDER — ORAL CARE MOUTH RINSE: 15.0000 mL | Freq: Once | OROMUCOSAL | Status: AC

## 2021-02-26 MED ORDER — PROPOFOL 10 MG/ML IV BOLUS
INTRAVENOUS | Status: AC
  Filled 2021-02-26: qty 20

## 2021-02-26 MED ORDER — SIMETHICONE 80 MG PO CHEW: 80.0000 mg | CHEWABLE_TABLET | Freq: Four times a day (QID) | ORAL | Status: DC | PRN

## 2021-02-26 MED ORDER — LUBIPROSTONE 24 MCG PO CAPS
24.0000 ug | ORAL_CAPSULE | Freq: Two times a day (BID) | ORAL | Status: DC
  Administered 2021-02-26 – 2021-02-27 (×2): 24 ug via ORAL
  Filled 2021-02-26 (×3): qty 1

## 2021-02-26 MED ORDER — CHLORHEXIDINE GLUCONATE 0.12 % MT SOLN
15.0000 mL | Freq: Once | OROMUCOSAL | Status: AC
  Administered 2021-02-26: 15 mL via OROMUCOSAL
  Filled 2021-02-26: qty 15

## 2021-02-26 MED ORDER — CEFAZOLIN SODIUM 1 G IJ SOLR
INTRAMUSCULAR | Status: AC
  Filled 2021-02-26: qty 20

## 2021-02-26 MED ORDER — HYDROCHLOROTHIAZIDE 12.5 MG PO CAPS
12.5000 mg | ORAL_CAPSULE | Freq: Every day | ORAL | Status: DC
  Administered 2021-02-26 – 2021-02-27 (×2): 12.5 mg via ORAL
  Filled 2021-02-26 (×2): qty 1

## 2021-02-26 MED ORDER — HYDROMORPHONE HCL 1 MG/ML IJ SOLN
INTRAMUSCULAR | Status: AC
  Filled 2021-02-26: qty 1

## 2021-02-26 MED ORDER — FENTANYL CITRATE (PF) 100 MCG/2ML IJ SOLN
INTRAMUSCULAR | Status: DC | PRN
  Administered 2021-02-26: 100 ug via INTRAVENOUS
  Administered 2021-02-26 (×2): 50 ug via INTRAVENOUS
  Administered 2021-02-26: 25 ug via INTRAVENOUS
  Administered 2021-02-26: 50 ug via INTRAVENOUS
  Administered 2021-02-26: 100 ug via INTRAVENOUS

## 2021-02-26 MED ORDER — KETOROLAC TROMETHAMINE 30 MG/ML IJ SOLN
INTRAMUSCULAR | Status: AC
  Administered 2021-02-26: 30 mg
  Filled 2021-02-26: qty 1

## 2021-02-26 MED ORDER — DIPHENHYDRAMINE HCL 12.5 MG/5ML PO ELIX: 12.5000 mg | ORAL_SOLUTION | Freq: Four times a day (QID) | ORAL | Status: DC | PRN

## 2021-02-26 MED ORDER — DEXAMETHASONE SODIUM PHOSPHATE 10 MG/ML IJ SOLN
INTRAMUSCULAR | Status: DC | PRN
  Administered 2021-02-26: 10 mg via INTRAVENOUS

## 2021-02-26 MED ORDER — OXYCODONE HCL 5 MG PO TABS: 5.0000 mg | ORAL_TABLET | ORAL | Status: DC | PRN

## 2021-02-26 MED ORDER — DEXTROSE IN LACTATED RINGERS 5 % IV SOLN: INTRAVENOUS | Status: DC

## 2021-02-26 MED ORDER — POVIDONE-IODINE 10 % EX SWAB
2.0000 "application " | Freq: Once | CUTANEOUS | Status: AC
  Administered 2021-02-26: 2 via TOPICAL

## 2021-02-26 MED ORDER — NALOXONE HCL 0.4 MG/ML IJ SOLN: 0.4000 mg | INTRAMUSCULAR | Status: DC | PRN

## 2021-02-26 MED ORDER — CEFAZOLIN SODIUM-DEXTROSE 2-4 GM/100ML-% IV SOLN
2.0000 g | Freq: Once | INTRAVENOUS | Status: DC
  Filled 2021-02-26: qty 100

## 2021-02-26 MED ORDER — PROMETHAZINE HCL 25 MG/ML IJ SOLN: 6.2500 mg | INTRAMUSCULAR | Status: DC | PRN

## 2021-02-26 MED ORDER — ONDANSETRON HCL 4 MG/2ML IJ SOLN
INTRAMUSCULAR | Status: DC | PRN
  Administered 2021-02-26: 4 mg via INTRAVENOUS

## 2021-02-26 MED ORDER — SUGAMMADEX SODIUM 200 MG/2ML IV SOLN
INTRAVENOUS | Status: DC | PRN
  Administered 2021-02-26: 200 mg via INTRAVENOUS

## 2021-02-26 MED ORDER — ROCURONIUM BROMIDE 100 MG/10ML IV SOLN
INTRAVENOUS | Status: DC | PRN
  Administered 2021-02-26: 20 mg via INTRAVENOUS
  Administered 2021-02-26: 80 mg via INTRAVENOUS

## 2021-02-26 MED ORDER — KETOROLAC TROMETHAMINE 30 MG/ML IJ SOLN
30.0000 mg | Freq: Once | INTRAMUSCULAR | Status: AC | PRN
  Administered 2021-02-26: 30 mg via INTRAVENOUS

## 2021-02-26 MED ORDER — IBUPROFEN 600 MG PO TABS: 600.0000 mg | ORAL_TABLET | Freq: Four times a day (QID) | ORAL | Status: DC

## 2021-02-26 MED ORDER — ONDANSETRON HCL 4 MG PO TABS: 4.0000 mg | ORAL_TABLET | Freq: Four times a day (QID) | ORAL | Status: DC | PRN

## 2021-02-26 MED ORDER — PANTOPRAZOLE SODIUM 40 MG PO TBEC
40.0000 mg | DELAYED_RELEASE_TABLET | Freq: Every day | ORAL | Status: DC
  Administered 2021-02-26 – 2021-02-27 (×2): 40 mg via ORAL
  Filled 2021-02-26 (×2): qty 1

## 2021-02-26 MED ORDER — ROCURONIUM BROMIDE 10 MG/ML (PF) SYRINGE
PREFILLED_SYRINGE | INTRAVENOUS | Status: AC
  Filled 2021-02-26: qty 10

## 2021-02-26 MED ORDER — PROPOFOL 10 MG/ML IV BOLUS
INTRAVENOUS | Status: DC | PRN
  Administered 2021-02-26 (×2): 50 mg via INTRAVENOUS
  Administered 2021-02-26: 150 mg via INTRAVENOUS
  Administered 2021-02-26: 50 mg via INTRAVENOUS

## 2021-02-26 MED ORDER — CELECOXIB 200 MG PO CAPS
ORAL_CAPSULE | ORAL | Status: AC
  Filled 2021-02-26: qty 1

## 2021-02-26 MED ORDER — MIDAZOLAM HCL 2 MG/2ML IJ SOLN
INTRAMUSCULAR | Status: AC
  Filled 2021-02-26: qty 2

## 2021-02-26 MED ORDER — LIDOCAINE HCL (CARDIAC) PF 100 MG/5ML IV SOSY
PREFILLED_SYRINGE | INTRAVENOUS | Status: DC | PRN
  Administered 2021-02-26: 100 mg via INTRAVENOUS

## 2021-02-26 MED ORDER — NORETHINDRONE 0.35 MG PO TABS: 1.0000 | ORAL_TABLET | Freq: Every day | ORAL | Status: DC

## 2021-02-26 MED ORDER — OLMESARTAN MEDOXOMIL-HCTZ 40-12.5 MG PO TABS: 1.0000 | ORAL_TABLET | Freq: Every day | ORAL | Status: DC

## 2021-02-26 MED ORDER — HYDROMORPHONE HCL 1 MG/ML IJ SOLN
0.2500 mg | INTRAMUSCULAR | Status: DC | PRN
  Administered 2021-02-26: 0.5 mg via INTRAVENOUS

## 2021-02-26 SURGICAL SUPPLY — 51 items
BARRIER ADHS 3X4 INTERCEED (GAUZE/BANDAGES/DRESSINGS) ×6 IMPLANT
CANISTER SUCT 3000ML PPV (MISCELLANEOUS) ×2 IMPLANT
CONT SPEC PATH 64OZ SNAP LID (MISCELLANEOUS) ×2 IMPLANT
DECANTER SPIKE VIAL GLASS SM (MISCELLANEOUS) ×2 IMPLANT
DRAPE CESAREAN BIRTH W POUCH (DRAPES) ×2 IMPLANT
DRAPE WARM FLUID 44X44 (DRAPES) ×2 IMPLANT
DRESSING PEEL AND PLAC PRVNA20 (GAUZE/BANDAGES/DRESSINGS) ×1 IMPLANT
DRSG OPSITE POSTOP 4X10 (GAUZE/BANDAGES/DRESSINGS) ×2 IMPLANT
DRSG PEEL AND PLACE PREVENA 20 (GAUZE/BANDAGES/DRESSINGS) ×2
DURAPREP 26ML APPLICATOR (WOUND CARE) ×2 IMPLANT
ELECT NEEDLE TIP 2.8 STRL (NEEDLE) ×2 IMPLANT
GAUZE 4X4 16PLY RFD (DISPOSABLE) IMPLANT
GAUZE SPONGE 4X4 12PLY STRL LF (GAUZE/BANDAGES/DRESSINGS) ×2 IMPLANT
GLOVE ECLIPSE 6.5 STRL STRAW (GLOVE) ×2 IMPLANT
GLOVE SURG UNDER POLY LF SZ7 (GLOVE) ×6 IMPLANT
GOWN STRL REUS W/ TWL LRG LVL3 (GOWN DISPOSABLE) ×3 IMPLANT
GOWN STRL REUS W/TWL LRG LVL3 (GOWN DISPOSABLE) ×3
KIT DRSG PREVENA PLUS 7DAY 125 (MISCELLANEOUS) ×2 IMPLANT
KIT TURNOVER KIT B (KITS) ×2 IMPLANT
NEEDLE HYPO 22GX1.5 SAFETY (NEEDLE) ×2 IMPLANT
NEEDLE SPNL 22GX3.5 QUINCKE BK (NEEDLE) ×2 IMPLANT
NS IRRIG 1000ML POUR BTL (IV SOLUTION) ×2 IMPLANT
PACK ABDOMINAL GYN (CUSTOM PROCEDURE TRAY) ×2 IMPLANT
PAD ARMBOARD 7.5X6 YLW CONV (MISCELLANEOUS) ×2 IMPLANT
PAD OB MATERNITY 4.3X12.25 (PERSONAL CARE ITEMS) ×2 IMPLANT
PENCIL SMOKE EVACUATOR (MISCELLANEOUS) ×2 IMPLANT
RETRACTOR TRAXI PANNICULUS (MISCELLANEOUS) ×1 IMPLANT
RTRCTR C-SECT PINK 25CM LRG (MISCELLANEOUS) ×2 IMPLANT
SPECIMEN JAR MEDIUM (MISCELLANEOUS) ×2 IMPLANT
SPONGE LAP 18X18 RF (DISPOSABLE) ×8 IMPLANT
STAPLER VISISTAT 35W (STAPLE) IMPLANT
SUT MON AB 2-0 SH 27 (SUTURE)
SUT MON AB 2-0 SH27 (SUTURE) IMPLANT
SUT MON AB 3-0 SH 27 (SUTURE) ×5
SUT MON AB 3-0 SH27 (SUTURE) ×5 IMPLANT
SUT MON AB 4-0 PS1 27 (SUTURE) ×2 IMPLANT
SUT PLAIN 2 0 XLH (SUTURE) IMPLANT
SUT PLAIN 3 0 XLH 27  52T (SUTURE) ×4 IMPLANT
SUT VIC AB 0 CT1 18XCR BRD8 (SUTURE) ×4 IMPLANT
SUT VIC AB 0 CT1 36 (SUTURE) ×4 IMPLANT
SUT VIC AB 0 CT1 8-18 (SUTURE) ×4
SUT VIC AB 2-0 CT1 27 (SUTURE) ×1
SUT VIC AB 2-0 CT1 TAPERPNT 27 (SUTURE) ×1 IMPLANT
SUT VIC AB 2-0 SH 27 (SUTURE)
SUT VIC AB 2-0 SH 27XBRD (SUTURE) IMPLANT
SUT VICRYL 4-0 PS2 18IN ABS (SUTURE) IMPLANT
SYR 10ML LL (SYRINGE) ×2 IMPLANT
SYR CONTROL 10ML LL (SYRINGE) ×4 IMPLANT
TOWEL GREEN STERILE FF (TOWEL DISPOSABLE) ×4 IMPLANT
TRAXI PANNICULUS RETRACTOR (MISCELLANEOUS) ×1
TRAY FOLEY W/BAG SLVR 14FR (SET/KITS/TRAYS/PACK) ×2 IMPLANT

## 2021-02-26 NOTE — Anesthesia Preprocedure Evaluation (Signed)
Anesthesia Evaluation  Patient identified by MRN, date of birth, ID band Patient awake    Reviewed: Allergy & Precautions, NPO status , Patient's Chart, lab work & pertinent test results  History of Anesthesia Complications (+) PONV and history of anesthetic complications  Airway Mallampati: I       Dental no notable dental hx.    Pulmonary neg pulmonary ROS,    Pulmonary exam normal        Cardiovascular hypertension, Pt. on medications Normal cardiovascular exam     Neuro/Psych negative neurological ROS  negative psych ROS   GI/Hepatic negative GI ROS, Neg liver ROS,   Endo/Other  negative endocrine ROS  Renal/GU negative Renal ROS  negative genitourinary   Musculoskeletal   Abdominal (+) + obese,   Peds  Hematology   Anesthesia Other Findings   Reproductive/Obstetrics                             Anesthesia Physical Anesthesia Plan  ASA: III  Anesthesia Plan: General   Post-op Pain Management:    Induction: Intravenous  PONV Risk Score and Plan: 4 or greater and Ondansetron, Dexamethasone and Midazolam  Airway Management Planned: Oral ETT  Additional Equipment: None  Intra-op Plan:   Post-operative Plan: Extubation in OR  Informed Consent: I have reviewed the patients History and Physical, chart, labs and discussed the procedure including the risks, benefits and alternatives for the proposed anesthesia with the patient or authorized representative who has indicated his/her understanding and acceptance.     Dental advisory given  Plan Discussed with: CRNA  Anesthesia Plan Comments:         Anesthesia Quick Evaluation

## 2021-02-26 NOTE — Brief Op Note (Signed)
02/26/2021  2:04 PM  PATIENT:  [name]  40 y.o. female  PRE-OPERATIVE DIAGNOSIS:  UTERINE FIBROID, IRON DEFICIENCY ANEMIA, Menorrhagia with regular cycle  POST-OPERATIVE DIAGNOSIS:  UTERINE FIBROID( SS/IM/SM/pedunculated), IRON DEFICIENCY ANEMIA, menorrhagia with regular cycle  PROCEDURE:  exploratory laparotomy, myomectomy  SURGEON:  Surgeon(s) and Role:    * Servando Salina, MD - Primary  PHYSICIAN ASSISTANT:   ASSISTANTS: Gaylord Shih , RNFA   ANESTHESIA:   general FINDINGS; multiple uterine fibroids, nl tubes and ovaries EBL:  250 mL   BLOOD ADMINISTERED:none  DRAINS: none   LOCAL MEDICATIONS USED:  MARCAINE     SPECIMEN:  Source of Specimen:  myoma x 19  DISPOSITION OF SPECIMEN:  PATHOLOGY  COUNTS:  YES  TOURNIQUET:  * No tourniquets in log *   DICTATION: .Other Dictation: Dictation Number [id number]  PLAN OF CARE: Admit to inpatient   PATIENT DISPOSITION:  PACU - hemodynamically stable.   Delay start of Pharmacological VTE agent (>24hrs) due to surgical blood loss or risk of bleeding: no

## 2021-02-26 NOTE — Interval H&P Note (Signed)
History and Physical Interval Note:  02/26/2021 11:07 AM  [name]  has presented today for surgery, with the diagnosis of UTERINE FIBROID, IRON DEFICIENCY ANEMIA.  The various methods of treatment have been discussed with the patient and family. After consideration of risks, benefits and other options for treatment, the patient has consented to exploratory laparotomy, myomectomy as a surgical intervention.  The patient's history has been reviewed, patient examined, no change in status, stable for surgery.  I have reviewed the patient's chart and labs.  Questions were answered to the patient's satisfaction.     Markon Jares A Johnnie Goynes

## 2021-02-26 NOTE — H&P (Signed)
[name] is an 40 y.o. female G0 MBF here for surgical mgmt of symptomatic uterine fibroids. Pt is scheduled for exp lap, myomectomy. She experiences heavy cycles with associated anemia. Desires to retain fertility. Sonogram showed uterus 17 cm with 10 cm SM fibroid  Pertinent Gynecological History: Menses: flow is excessive with use of 5 pads or tampons on heaviest days Bleeding: menorrhagia Contraception: none DES exposure: denies Blood transfusions: none Sexually transmitted diseases: no past history Previous GYN Procedures: n/a  Last mammogram: normal Date: 2021 Last pap: normal Date: 2021 OB History: G0   Menstrual History: Menarche age: n/a No LMP recorded.    Past Medical History:  Diagnosis Date  . Anemia   . Arthritis    bilateral knees  . Hypertension   . PONV (postoperative nausea and vomiting)     Past Surgical History:  Procedure Laterality Date  . CHOLECYSTECTOMY N/A 11/06/2017   Procedure: LAPAROSCOPIC CHOLECYSTECTOMY;  Surgeon: Ralene Ok, MD;  Location: [facility];  Service: General;  Laterality: N/A;    No family history on file.  Social History:  reports that she has never smoked. She has never used smokeless tobacco. She reports that she does not drink alcohol and does not use drugs.  Allergies: No Known Allergies  No medications prior to admission.    Review of Systems  All other systems reviewed and are negative.   There were no vitals taken for this visit. Physical Exam Constitutional:      Appearance: Normal appearance. She is obese.  HENT:     Head: Atraumatic.  Eyes:     Extraocular Movements: Extraocular movements intact.  Cardiovascular:     Rate and Rhythm: Regular rhythm.     Heart sounds: Normal heart sounds.  Pulmonary:     Breath sounds: Normal breath sounds.  Abdominal:     Palpations: There is mass.     Tenderness: There is no right CVA tenderness or left CVA tenderness.     Comments: Uterus at umb   Genitourinary:    General: Normal vulva.     Comments: Vagina nl  cervix nulliparous Uterus 20 wk size Adnexa non palp Musculoskeletal:        General: No swelling.     Cervical back: Neck supple.  Skin:    General: Skin is warm and dry.  Neurological:     General: No focal deficit present.     Mental Status: She is alert and oriented to person, place, and time.  Psychiatric:        Mood and Affect: Mood normal.        Behavior: Behavior normal.     No results found for this or any previous visit (from the past 24 hour(s)).  No results found.  Assessment/Plan: Menorrhagia with regular cycles IDA  uterine fibroid Morbid obesity P) exp lap, myomectomy. Risk of surgery reviewed Including infection , bleeding, poss need for blood transfusion and its risk( HIV, acute rxn, hepatitis), injury to surrounding organ structures, internal scar tissue which may cause infertility. Probable need for C/S in the future, 2% chance of hysterectomy, up to 30% risk of needing same surgery in the future. All ? answered  [name] A [name] 02/26/2021, 7:39 AM

## 2021-02-26 NOTE — Plan of Care (Signed)

## 2021-02-26 NOTE — Anesthesia Procedure Notes (Addendum)
Procedure Name: Intubation Date/Time: 02/26/2021 11:39 AM Performed by: Elenore Paddy, CRNA Pre-anesthesia Checklist: Patient identified, Emergency Drugs available, Suction available, Timeout performed and Patient being monitored Patient Re-evaluated:Patient Re-evaluated prior to induction Oxygen Delivery Method: Circle system utilized Preoxygenation: Pre-oxygenation with 100% oxygen Induction Type: IV induction Ventilation: Oral airway inserted - appropriate to patient size Laryngoscope Size: Mac and 3 Grade View: Grade II Tube type: Oral Tube size: 7.0 mm Number of attempts: 1 Airway Equipment and Method: Stylet Placement Confirmation: ETT inserted through vocal cords under direct vision and positive ETCO2 Secured at: 22 cm Tube secured with: Tape Dental Injury: Teeth and Oropharynx as per pre-operative assessment

## 2021-02-26 NOTE — Interval H&P Note (Signed)
History and Physical Interval Note:  02/26/2021 11:05 AM  [name]  has presented today for surgery, with the diagnosis of UTERINE FIBROID, IRON DEFICIENCY ANEMIA.  The various methods of treatment have been discussed with the patient and family. After consideration of risks, benefits and other options for treatment, the patient has consented to  exploratory laparotomy, myomectomy as a surgical intervention.  The patient's history has been reviewed, patient examined, no change in status, stable for surgery.  I have reviewed the patient's chart and labs.  Questions were answered to the patient's satisfaction.     Helon Wisinski A Abrahim Sargent

## 2021-02-26 NOTE — Interval H&P Note (Signed)
History and Physical Interval Note:  02/26/2021 11:06 AM  [name]  has presented today for surgery, with the diagnosis of UTERINE FIBROID, IRON DEFICIENCY ANEMIA.  The various methods of treatment have been discussed with the patient and family. After consideration of risks, benefits and other options for treatment, the patient has consented to  Procedure(s) with comments: ABDOMINAL MYOMECTOMY (N/A) - Tracie RNFA confirmed on 02/10/21 CS as a surgical intervention.  The patient's history has been reviewed, patient examined, no change in status, stable for surgery.  I have reviewed the patient's chart and labs.  Questions were answered to the patient's satisfaction.     [name]

## 2021-02-26 NOTE — Anesthesia Postprocedure Evaluation (Signed)
Anesthesia Post Note  Patient: [name]  Procedure(s) Performed: ABDOMINAL MYOMECTOMY (N/A Abdomen)     Patient location during evaluation: PACU Anesthesia Type: General Level of consciousness: sedated Pain management: pain level controlled Vital Signs Assessment: post-procedure vital signs reviewed and stable Respiratory status: spontaneous breathing Cardiovascular status: stable Postop Assessment: no apparent nausea or vomiting Anesthetic complications: no   No complications documented.  Last Vitals:  Vitals:   02/26/21 1555 02/26/21 1610  BP: 126/67 122/68  Pulse: 70 67  Resp: 17 13  Temp:  36.7 C  SpO2: 100% 99%    Last Pain:  Vitals:   02/26/21 1540  PainSc: Huetter Jr

## 2021-02-26 NOTE — Transfer of Care (Signed)
Immediate Anesthesia Transfer of Care Note  Patient: [name]  Procedure(s) Performed: ABDOMINAL MYOMECTOMY (N/A Abdomen)  Patient Location: PACU  Anesthesia Type:General  Level of Consciousness: awake, alert  and oriented  Airway & Oxygen Therapy: Patient Spontanous Breathing and Patient connected to nasal cannula oxygen  Post-op Assessment: Report given to RN and Post -op Vital signs reviewed and stable  Post vital signs: Reviewed and stable  Last Vitals:  Vitals Value Taken Time  BP 111/66 02/26/21 1425  Temp    Pulse 69 02/26/21 1428  Resp 14 02/26/21 1428  SpO2 100 % 02/26/21 1428  Vitals shown include unvalidated device data.  Last Pain:  Vitals:   02/26/21 0952  PainSc: 0-No pain         Complications: No complications documented.

## 2021-02-27 ENCOUNTER — Encounter (HOSPITAL_COMMUNITY): Payer: Self-pay | Admitting: Obstetrics and Gynecology

## 2021-02-27 LAB — CBC
HCT: 33 % — ABNORMAL LOW (ref 36.0–46.0)
Hemoglobin: 10.3 g/dL — ABNORMAL LOW (ref 12.0–15.0)
MCH: 27.4 pg (ref 26.0–34.0)
MCHC: 31.2 g/dL (ref 30.0–36.0)
MCV: 87.8 fL (ref 80.0–100.0)
Platelets: 280 10*3/uL (ref 150–400)
RBC: 3.76 MIL/uL — ABNORMAL LOW (ref 3.87–5.11)
RDW: 23.3 % — ABNORMAL HIGH (ref 11.5–15.5)
WBC: 14.6 10*3/uL — ABNORMAL HIGH (ref 4.0–10.5)
nRBC: 0 % (ref 0.0–0.2)

## 2021-02-27 MED ORDER — IBUPROFEN 600 MG PO TABS: 600.0000 mg | ORAL_TABLET | Freq: Four times a day (QID) | ORAL | 11 refills | Status: AC | PRN

## 2021-02-27 MED ORDER — OXYCODONE HCL 5 MG PO TABS: 5.0000 mg | ORAL_TABLET | ORAL | 0 refills | Status: AC | PRN

## 2021-02-27 MED ORDER — SIMETHICONE 80 MG PO CHEW: 160.0000 mg | CHEWABLE_TABLET | Freq: Four times a day (QID) | ORAL | 5 refills | Status: AC

## 2021-02-27 NOTE — Op Note (Signed)
NAMEMARYLON, [name] MEDICAL RECORD NO: [id number] ACCOUNT NO: [id number] DATE OF BIRTH: [birth year] FACILITY: [facility] LOCATION: [facility] PHYSICIAN: Sally-Anne Wamble A. Garwin Brothers, MD  Operative Report   DATE OF PROCEDURE: 02/26/2021  PREOPERATIVE DIAGNOSIS:  Symptomatic uterine fibroids.  POSTOPERATIVE DIAGNOSIS:  Symptomatic uterine fibroids.  PROCEDURE:  Exploratory laparotomy and multiple myomectomies.  ANESTHESIA:  General.  SURGEON:  Mance Vallejo A. Garwin Brothers, MD  ASSISTANT:  Sullivan Lone, RNFA.    PROCEDURE:  Under adequate general anesthesia, the patient was placed in the supine position.  Examination under anesthesia revealed an 18-week size uterus, irregular, mobile, limited by the patient's body habitus.  The patient was sterilely prepped and  draped in sterile fashion.  Indwelling Foley catheter was sterilely placed. A traxi retractor was placed.  Then after prepping, a Pfannenstiel skin incision was made with 0.25% Marcaine being injected prior. Incision was carried down to the rectus  fascia.  Rectus fascia was opened transversely.  Rectus fascia was then bluntly and sharply dissected off the rectus muscles in a superior and inferior fashion.  The rectus muscle was split in the midline.  The parietal peritoneum was entered bluntly and  extended.  Exploration of the abdomen revealed the uterus contained multiple fibroids.  The uterus was exteriorized.  Inspection showed normal tubes and ovaries bilaterally, a large 8 cm posterior fibroid, a pedunculated 6 cm left posterior fibroid,  several other subserosal type fibroid including fundal and anteriorly.  The procedure was started posteriorly with dilute solution of Pitressin injected in a vertical fashion.  Vertical skin incision was then made overlying and through one fibroid.  That  fibroid was enucleated and cut in pieces.  The underlying fibroid was then removed, showing evidence of another larger fibroid underneath and larger fibroid, which  was about 10 cm, was removed.  It clearly was abutting next to the endometrium.   Additional fibroids were removed through that defect.  Then, attention was then turned anteriorly where a similar vertical incision was then made overlying fibroid, removed in a similar fashion, but with that removal of the fibroid, the endometrial  cavity which had not officially been entered could be palpated through both anterior and posterior incisions.  Any additional fibroids that could be palpated were removed.  Once this was done, the defects were closed with interrupted 2-0 Vicryl sutures  anteriorly and posteriorly, carried to the serosal surface, which was then closed with 3-0 Monocryl sutures in a baseball stitch fashion.  A separate fibroid was removed posteriorly as well and all palpable fibroids were removed and the defect closed.   Procedure was felt to be complete.  Abdomen was then irrigated and suctioned of debris.  The anterior incision had some horizontal mattress sutures being placed for hemostasis from the bleeding on the incision line.  The Interceed was placed overlying  the anterior and posterior incisions and returned back to the abdomen.  The parietal peritoneum was then closed with 2-0 Vicryl.  The rectus fascia was closed with 0 Vicryl x2.  The subcutaneous area was irrigated.  Small bleeders cauterized.   Interrupted 2-0 plain sutures were placed and the skin incision was then closed with 4-0 Monocryl subcuticular closure.  Prevena wound vacuum was placed about an inch and a half depth of the subcutaneous area.  SPECIMEN:  Myomas x19 sent to pathology.    INTRAOPERATIVE FLUID:  2 liters.   URINE OUTPUT:  250  ESTIMATED BLOOD LOSS: 409  COMPLICATION:  None.    The patient tolerated the procedure  well and was transferred to the recovery room in stable condition.  She will need a cesarean section for future deliveries.   PAA D: 02/26/2021 7:52:19 pm T: 02/27/2021 4:22:00 am  JOB:  [id number]/ [id number]

## 2021-02-27 NOTE — Progress Notes (Signed)
AVS given and reviewed with pt. Medications discussed. All questions answered to satisfaction. Pt verbalized understanding of information given, including wound vac care. Pt escorted off the unit with all belongings via wheelchair by staff member.

## 2021-02-27 NOTE — Discharge Summary (Signed)
Physician Discharge Summary  Patient ID: [name] MRN: [id number] DOB/AGE: 1980-11-28 40 y.o.  Admit date: 02/26/2021 Discharge date: 02/27/2021  Admission Diagnoses: menorrhagia with regular cycles, IDA, uterine fibroid  Discharge Diagnoses: same Active Problems:   Uterine fibroid   S/P myomectomy   Discharged Condition: stable  Hospital Course: pt underwent exp lap, myomectomy. Uncomplicated postop course  Consults: None  Significant Diagnostic Studies: labs:  CBC Latest Ref Rng & Units 02/27/2021 02/24/2021 11/06/2017  WBC 4.0 - 10.5 K/uL 14.6(H) 5.1 13.7(H)  Hemoglobin 12.0 - 15.0 g/dL 10.3(L) 11.8(L) 13.2  Hematocrit 36.0 - 46.0 % 33.0(L) 38.6 39.2  Platelets 150 - 400 K/uL 280 366 279     Treatments: surgery: exp lap, myomectomy  Discharge Exam: Blood pressure (!) 104/59, pulse 81, temperature 98 F (36.7 C), temperature source Oral, resp. rate 18, height 5\' 3"  (1.6 m), weight 110.7 kg, SpO2 100 %. General appearance: alert, cooperative, and no distress Resp: clear to auscultation bilaterally Cardio: regular rate and rhythm, S1, S2 normal, no murmur, click, rub or gallop GI: soft, non-tender; bowel sounds normal; no masses,  no organomegaly Pelvic: deferred Extremities: no edema, redness or tenderness in the calves or thighs Incision/Wound: c/d/i  Disposition: Discharge disposition: 01-Home or Self Care       Discharge Instructions     Call MD for:  persistant nausea and vomiting   Complete by: As directed    Call MD for:  redness, tenderness, or signs of infection (pain, swelling, redness, odor or green/yellow discharge around incision site)   Complete by: As directed    Call MD for:  severe uncontrolled pain   Complete by: As directed    Call MD for:  temperature >100.4   Complete by: As directed    Diet - low sodium heart healthy   Complete by: As directed    Discharge wound care:   Complete by: As directed    Wound vacuum removal in office  next MOnday   May walk up steps   Complete by: As directed       Allergies as of 02/27/2021   No Known Allergies      Medication List     TAKE these medications    cholecalciferol 25 MCG (1000 UNIT) tablet Commonly known as: VITAMIN D3 Take 1,000 Units by mouth daily.   diclofenac Sodium 1 % Gel Commonly known as: VOLTAREN Apply 1 application topically 4 (four) times daily as needed (pain).   ferrous sulfate 325 (65 FE) MG tablet Take 325 mg by mouth daily with breakfast.   ibuprofen 600 MG tablet Commonly known as: ADVIL Take 1 tablet (600 mg total) by mouth every 6 (six) hours as needed.   lubiprostone 24 MCG capsule Commonly known as: AMITIZA Take 24 mcg by mouth 2 (two) times daily with a meal.   norethindrone 0.35 MG tablet Commonly known as: MICRONOR Take 1 tablet by mouth daily.   olmesartan-hydrochlorothiazide 40-12.5 MG tablet Commonly known as: BENICAR HCT Take 1 tablet by mouth daily.   oxyCODONE 5 MG immediate release tablet Commonly known as: Oxy IR/ROXICODONE Take 1-2 tablets (5-10 mg total) by mouth every 4 (four) hours as needed for up to 7 days for moderate pain.   simethicone 80 MG chewable tablet Commonly known as: MYLICON Chew 2 tablets (160 mg total) by mouth 4 (four) times daily.               Discharge Care Instructions  (From admission, onward)  Start     Ordered   02/27/21 0000  Discharge wound care:       Comments: Wound vacuum removal in office next MOnday   02/27/21 1137            Follow-up Information     Servando Salina, MD Follow up.   Specialty: Obstetrics and Gynecology Why: for wound vacuum removal Contact information: [address] [place] [address] [phone]                 Signed: Marvene Staff 02/27/2021, 11:38 AM

## 2021-02-27 NOTE — Progress Notes (Signed)
Subjective: Patient reports tolerating PO, + flatus, and no problems voiding.    Objective: I have reviewed patient's vital signs.  vital signs, intake and output, and labs. Vitals:   02/27/21 0630 02/27/21 1046  BP: 127/63 (!) 104/59  Pulse: 87 81  Resp: 17 18  Temp: 99.2 F (37.3 C) 98 F (36.7 C)  SpO2: 98% 100%   I/O last 3 completed shifts: In: 2771.1 [P.O.:240; I.V.:2511.1; IV Piggyback:20] Out: 850 [Urine:600; Blood:250] Total I/O In: -  Out: 400 [Urine:400]  Lab Results  Component Value Date   WBC 14.6 (H) 02/27/2021   HGB 10.3 (L) 02/27/2021   HCT 33.0 (L) 02/27/2021   MCV 87.8 02/27/2021   PLT 280 02/27/2021   Lab Results  Component Value Date   CREATININE 0.87 02/24/2021    EXAM General: alert, cooperative, and appears stated age Resp: clear to auscultation bilaterally Cardio: regular rate and rhythm, S1, S2 normal, no murmur, click, rub or gallop GI: soft, non-tender; bowel sounds normal; no masses,  no organomegaly and incision: clean, dry, and intact Extremities: no edema, redness or tenderness in the calves or thighs Vaginal Bleeding: minimal  Assessment: s/p Procedure(s): ABDOMINAL MYOMECTOMY: stable, tolerating diet, and anemia IDA Plan: Advance diet Encourage ambulation Advance to PO medication Discontinue IV fluids Discharge home D/c instructions reviewed F/u 4 wk F/u next week for wound vac removal  LOS: 1 day    Marvene Staff, MD 02/27/2021 11:24 AM    02/27/2021, 11:24 AM

## 2021-03-02 LAB — SURGICAL PATHOLOGY

## 2022-11-24 ENCOUNTER — Institutional Professional Consult (permissible substitution): Payer: BC Managed Care – PPO | Admitting: Plastic Surgery
